# Patient Record
Sex: Female | Born: 1975 | Race: White | Hispanic: No | Marital: Married | Smoking: Former smoker
Health system: Southern US, Community
[De-identification: ages and names within clinical notes are randomized; demographics above are authoritative.]

## PROBLEM LIST (undated history)

## (undated) DIAGNOSIS — M797 Fibromyalgia: Secondary | ICD-10-CM

## (undated) DIAGNOSIS — D649 Anemia, unspecified: Secondary | ICD-10-CM

## (undated) DIAGNOSIS — N3010 Interstitial cystitis (chronic) without hematuria: Secondary | ICD-10-CM

## (undated) DIAGNOSIS — K5790 Diverticulosis of intestine, part unspecified, without perforation or abscess without bleeding: Secondary | ICD-10-CM

## (undated) DIAGNOSIS — K5792 Diverticulitis of intestine, part unspecified, without perforation or abscess without bleeding: Secondary | ICD-10-CM

## (undated) DIAGNOSIS — M719 Bursopathy, unspecified: Secondary | ICD-10-CM

## (undated) HISTORY — DX: Diverticulosis of intestine, part unspecified, without perforation or abscess without bleeding: K57.90

## (undated) HISTORY — DX: Interstitial cystitis (chronic) without hematuria: N30.10

## (undated) HISTORY — DX: Anemia, unspecified: D64.9

## (undated) HISTORY — PX: CERVICAL SPINE SURGERY: SHX589

## (undated) HISTORY — DX: Diverticulitis of intestine, part unspecified, without perforation or abscess without bleeding: K57.92

## (undated) HISTORY — DX: Bursopathy, unspecified: M71.9

## (undated) HISTORY — DX: Fibromyalgia: M79.7

---

## 2005-06-14 ENCOUNTER — Emergency Department: Payer: Self-pay | Admitting: Emergency Medicine

## 2007-09-20 ENCOUNTER — Emergency Department: Payer: Self-pay | Admitting: Internal Medicine

## 2009-09-02 HISTORY — PX: ABDOMINAL HYSTERECTOMY: SHX81

## 2010-01-10 ENCOUNTER — Ambulatory Visit: Payer: Self-pay | Admitting: Internal Medicine

## 2010-01-10 DIAGNOSIS — R21 Rash and other nonspecific skin eruption: Secondary | ICD-10-CM | POA: Insufficient documentation

## 2010-01-15 ENCOUNTER — Telehealth (INDEPENDENT_AMBULATORY_CARE_PROVIDER_SITE_OTHER): Payer: Self-pay | Admitting: *Deleted

## 2010-01-15 LAB — CONVERTED CEMR LAB
Basophils Absolute: 0 10*3/uL (ref 0.0–0.1)
Eosinophils Absolute: 0.1 10*3/uL (ref 0.0–0.7)
HCT: 41.3 % (ref 36.0–46.0)
Lymphs Abs: 2.3 10*3/uL (ref 0.7–4.0)
MCV: 90.2 fL (ref 78.0–100.0)
Monocytes Absolute: 0.3 10*3/uL (ref 0.1–1.0)
Platelets: 463 10*3/uL — ABNORMAL HIGH (ref 150.0–400.0)
RDW: 13.9 % (ref 11.5–14.6)
Sed Rate: 10 mm/hr (ref 0–22)

## 2010-02-22 ENCOUNTER — Ambulatory Visit: Payer: Self-pay | Admitting: Internal Medicine

## 2010-05-11 ENCOUNTER — Emergency Department: Payer: Self-pay | Admitting: Emergency Medicine

## 2010-05-11 IMAGING — CR DG CHEST 2V
1 series · 3 of 3 positions shown · non-contrast
Comparison: none

REASON FOR EXAM: FB
COMMENTS:

[Series 1: view not recorded · 0.17mm/px · 3 of 3 slices shown]
[im 1/3]
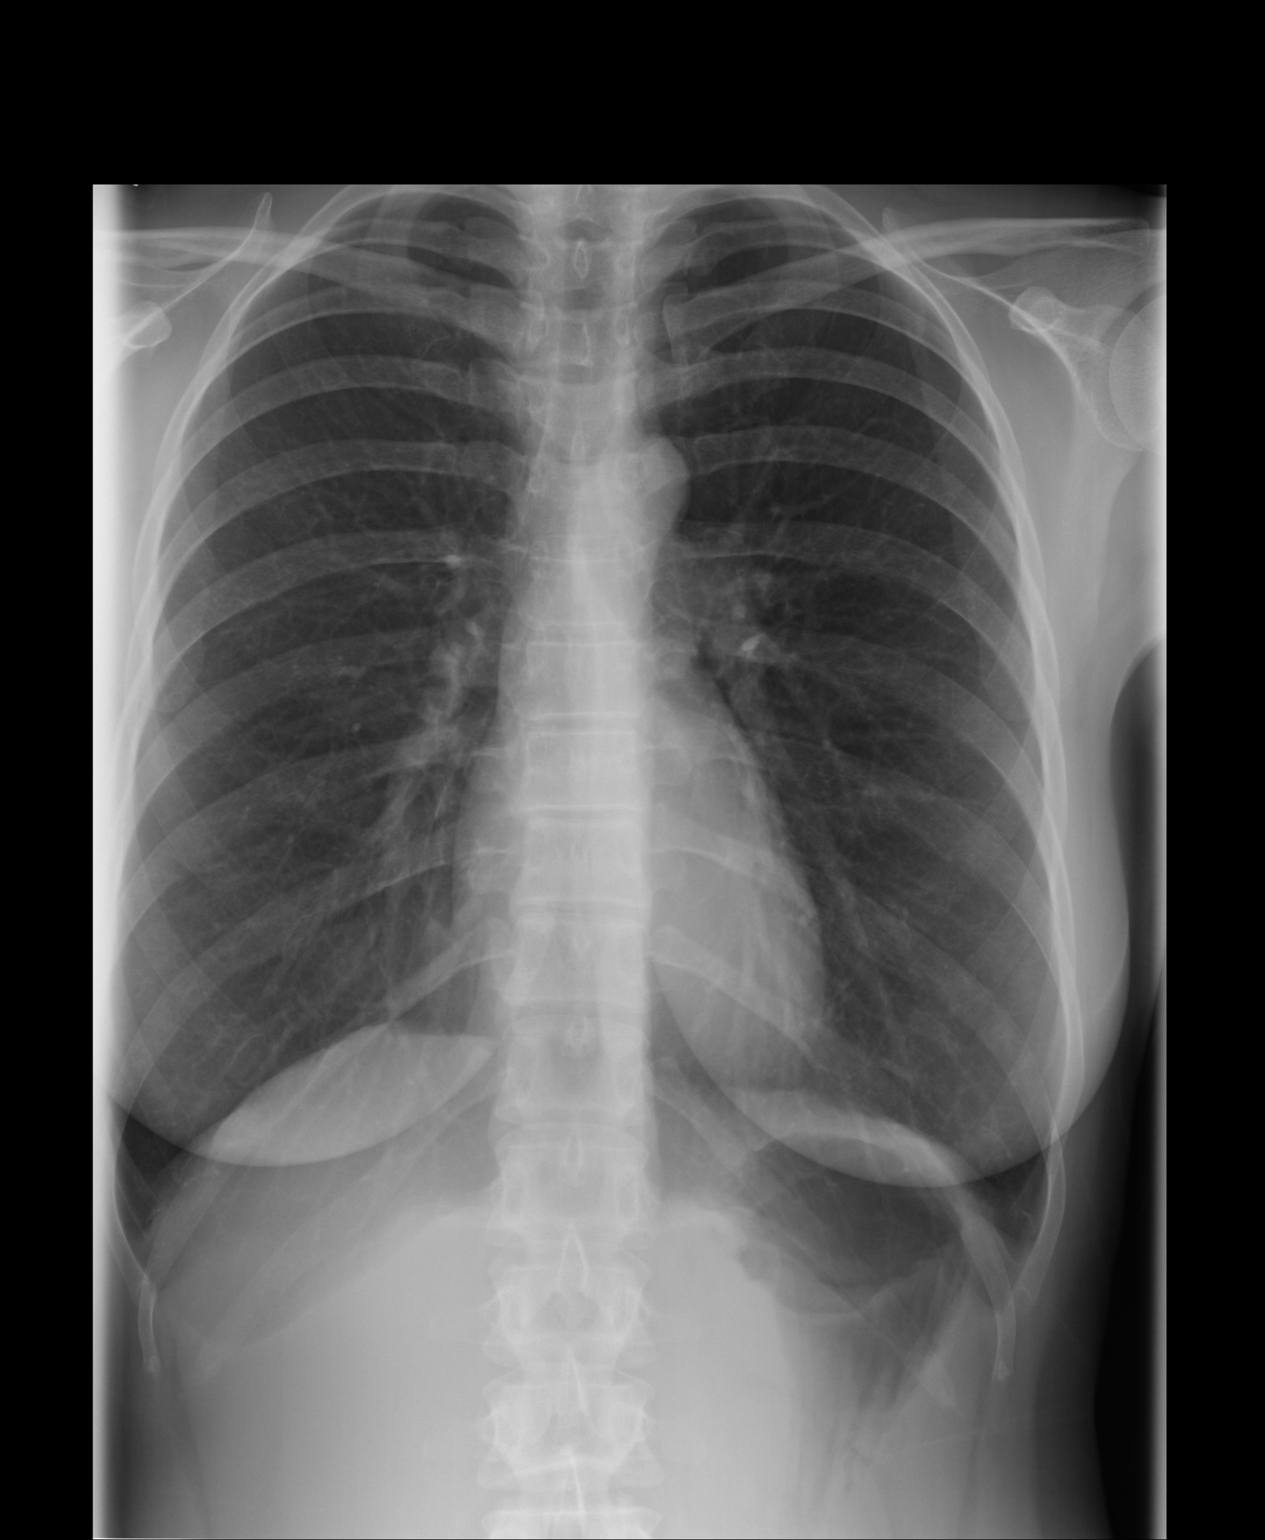
[im 2/3]
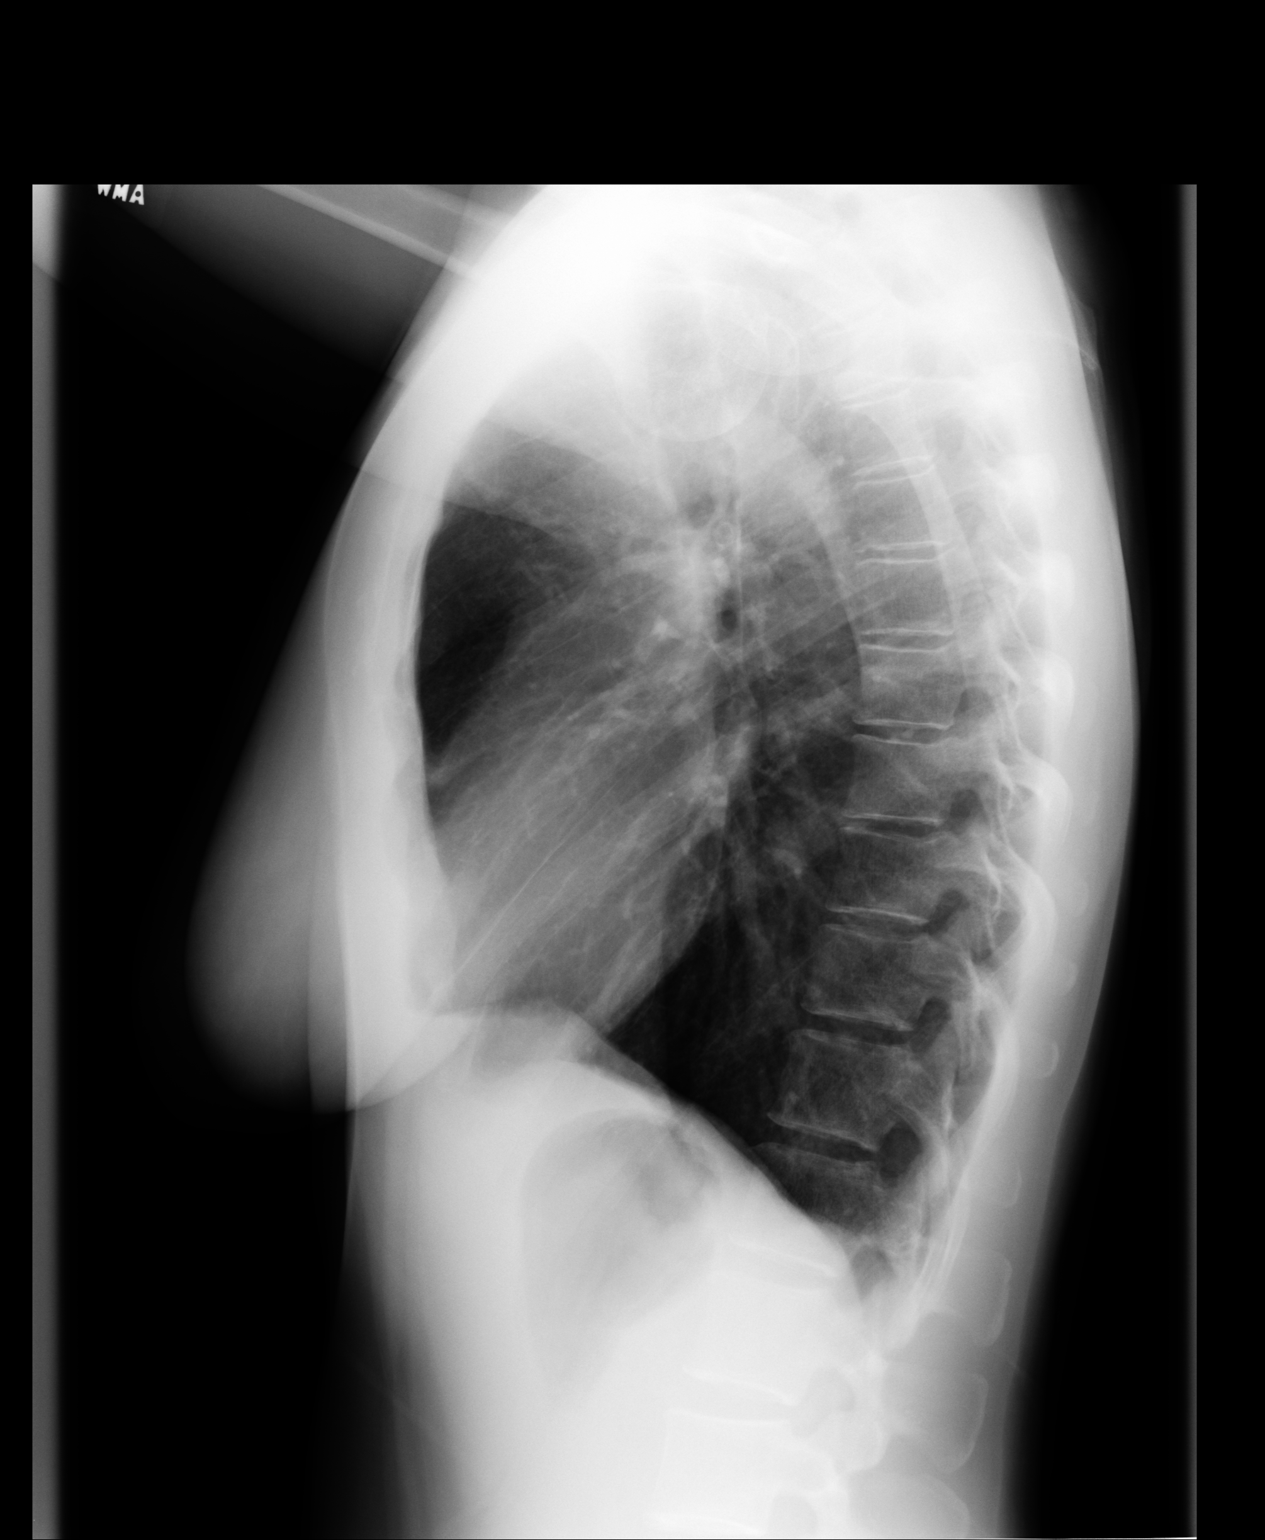
[im 3/3]
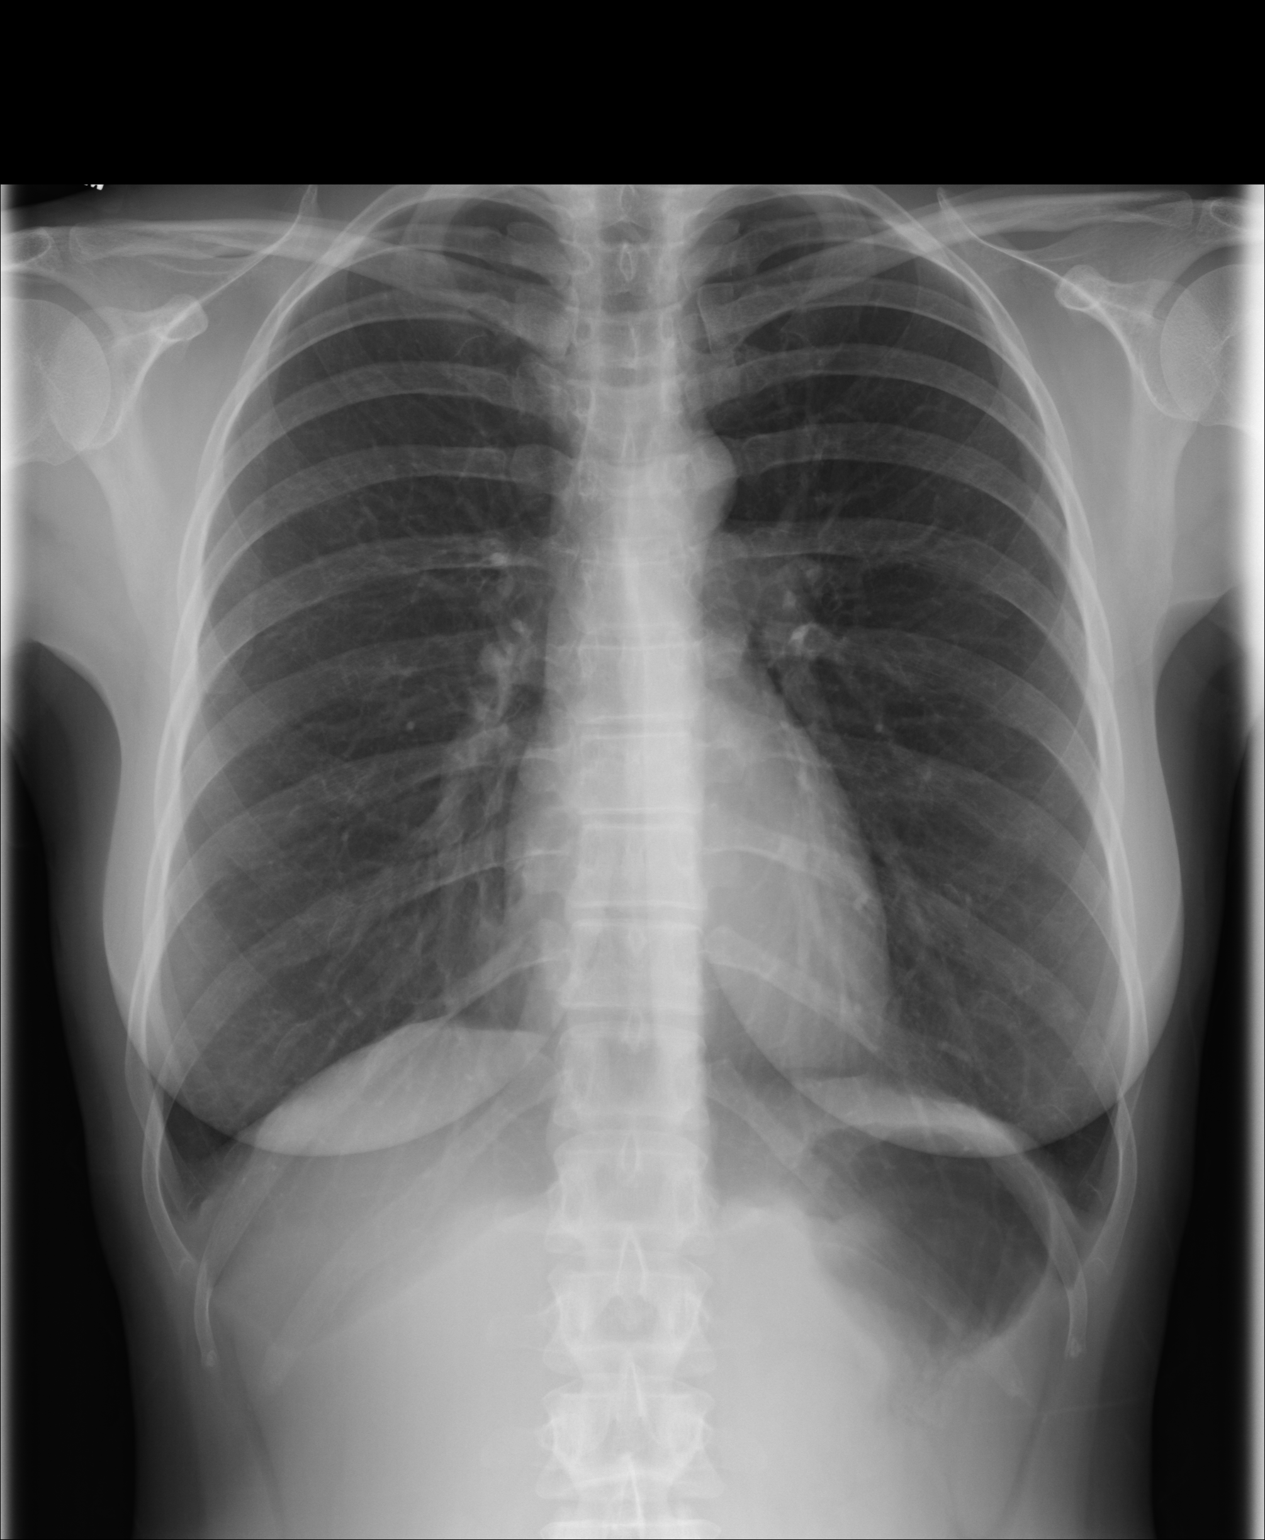

[3 of 3 positions shown; findings below may reference images not displayed]

PROCEDURE:     DXR - DXR CHEST PA (OR AP) AND LATERAL  - [DATE] [DATE]

RESULT:     The lung fields are clear. No pneumonia, pneumothorax or pleural
effusion is seen. The chest appears hyperexpanded bilaterally. The finding
could be due to body habitus but a history of reactive airway disease cannot
be excluded.
IMPRESSION: 1. The lung fields are clear.
2. No foreign body is seen in the visualized portion of the lower neck or
chest.
3. The chest appears bilaterally hyperexpanded suspicious for a history of
reactive airway disease.

## 2010-05-11 IMAGING — CR NECK SOFT TISSUES - 1+ VIEW
1 series · 2 of 2 positions shown · non-contrast
Comparison: none

REASON FOR EXAM: FB stuck in throat
COMMENTS:

[Series 1: view not recorded · 0.17mm/px · 2 of 2 slices shown]
[im 1/2]
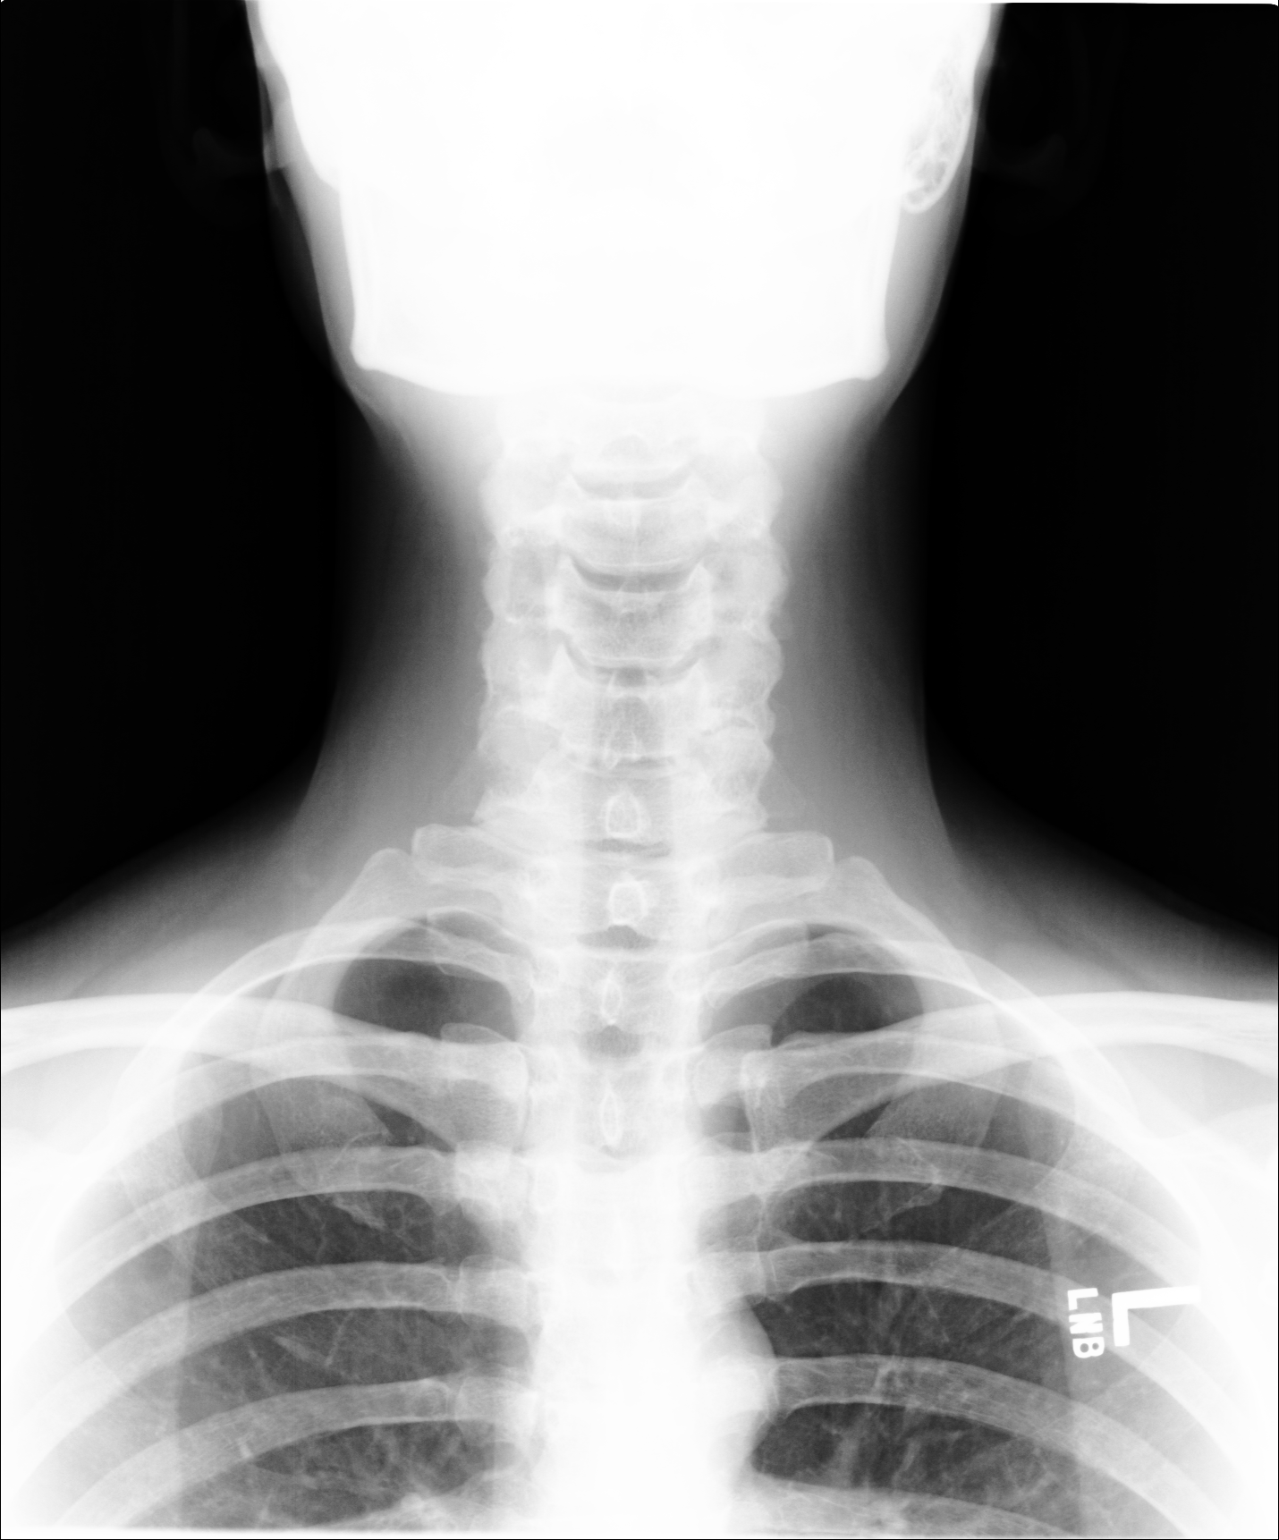
[im 2/2]
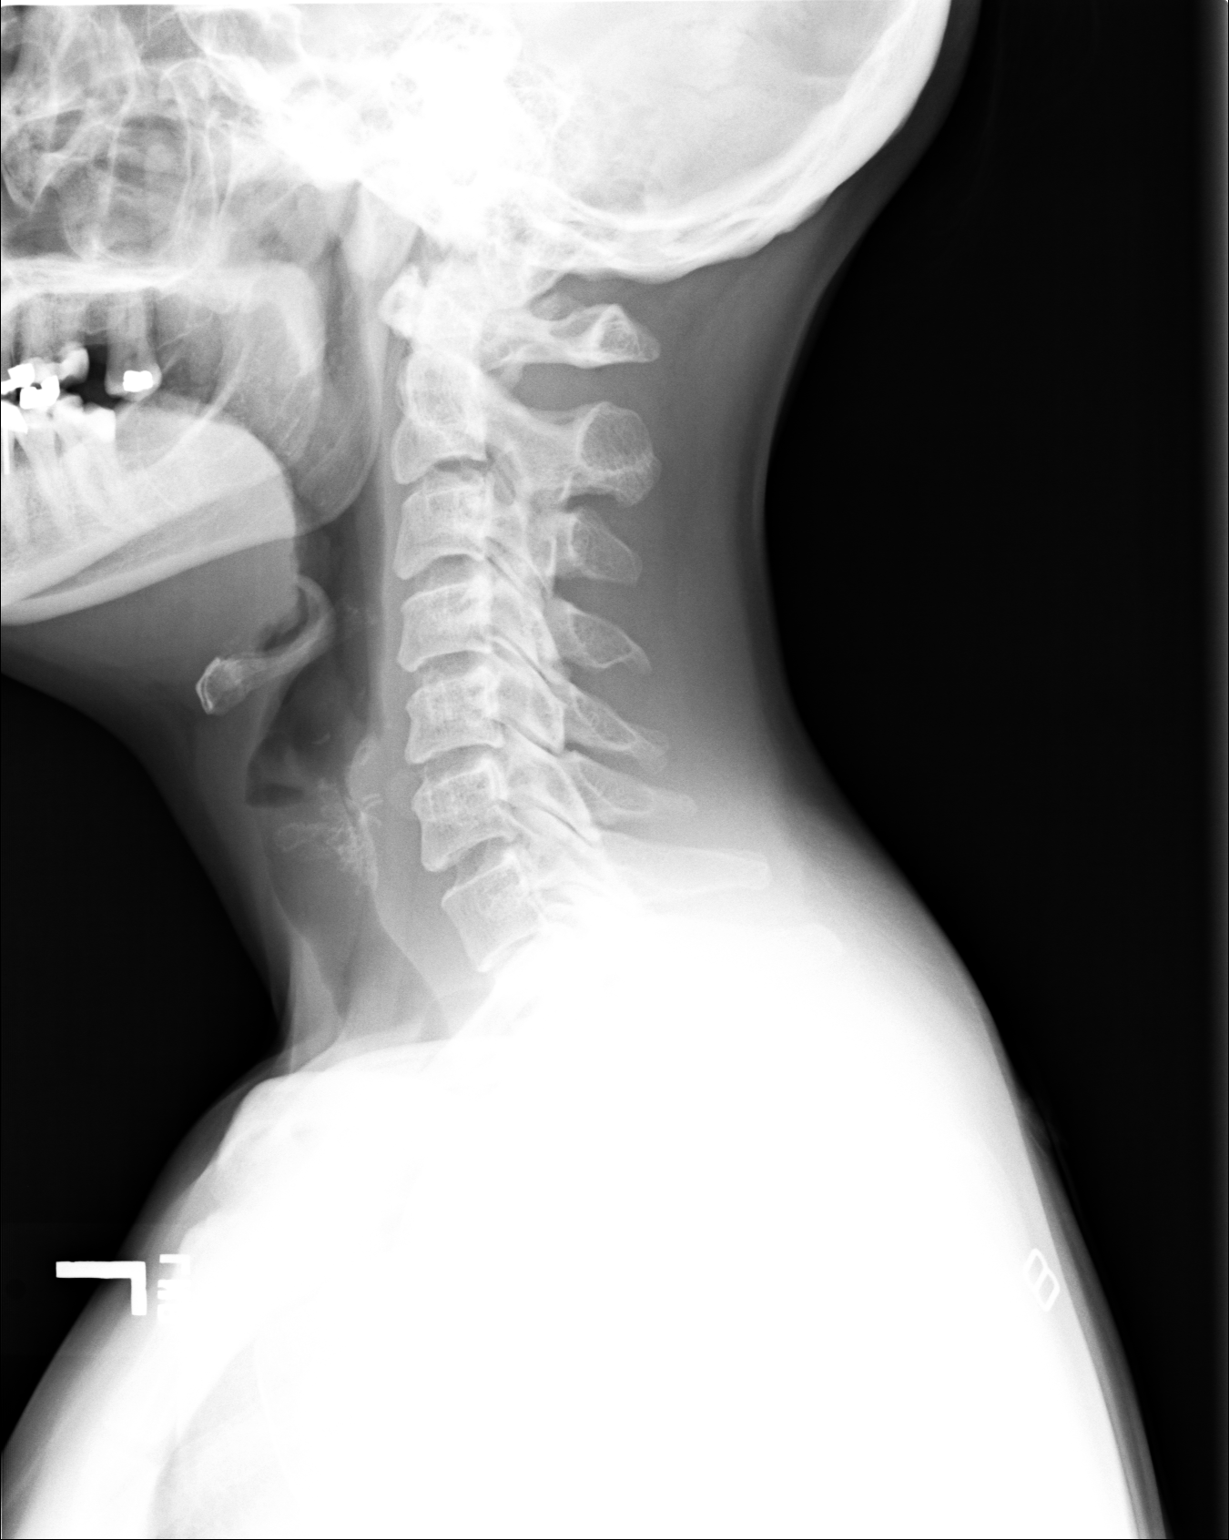

[2 of 2 positions shown; findings below may reference images not displayed]

PROCEDURE:     DXR - DXR SOFT TISSUE NECK  - [DATE] [DATE]

RESULT:     Views of the neck obtained with soft tissue technique show no
definite radiopaque foreign body. Note is made that some fish bones are
cartilaginous and are not visualized on routine radiography. No soft tissue
gas is identified. No retropharyngeal soft tissue swelling is seen. The
epiglottis is normal in size.
IMPRESSION: 1.     No significant abnormalities are noted.

## 2010-08-16 ENCOUNTER — Encounter
Admission: RE | Admit: 2010-08-16 | Discharge: 2010-08-16 | Payer: Self-pay | Source: Home / Self Care | Attending: Geriatric Medicine | Admitting: Geriatric Medicine

## 2010-08-16 IMAGING — CR DG FACIAL BONES COMPLETE 3+V
4 series · 4 of 4 positions shown · non-contrast
Comparison: None.

CLINICAL DATA: Left-sided facial pain, injured 2 days ago

FACIAL BONES COMPLETE 3+V

[view not recorded (1 of 4)]
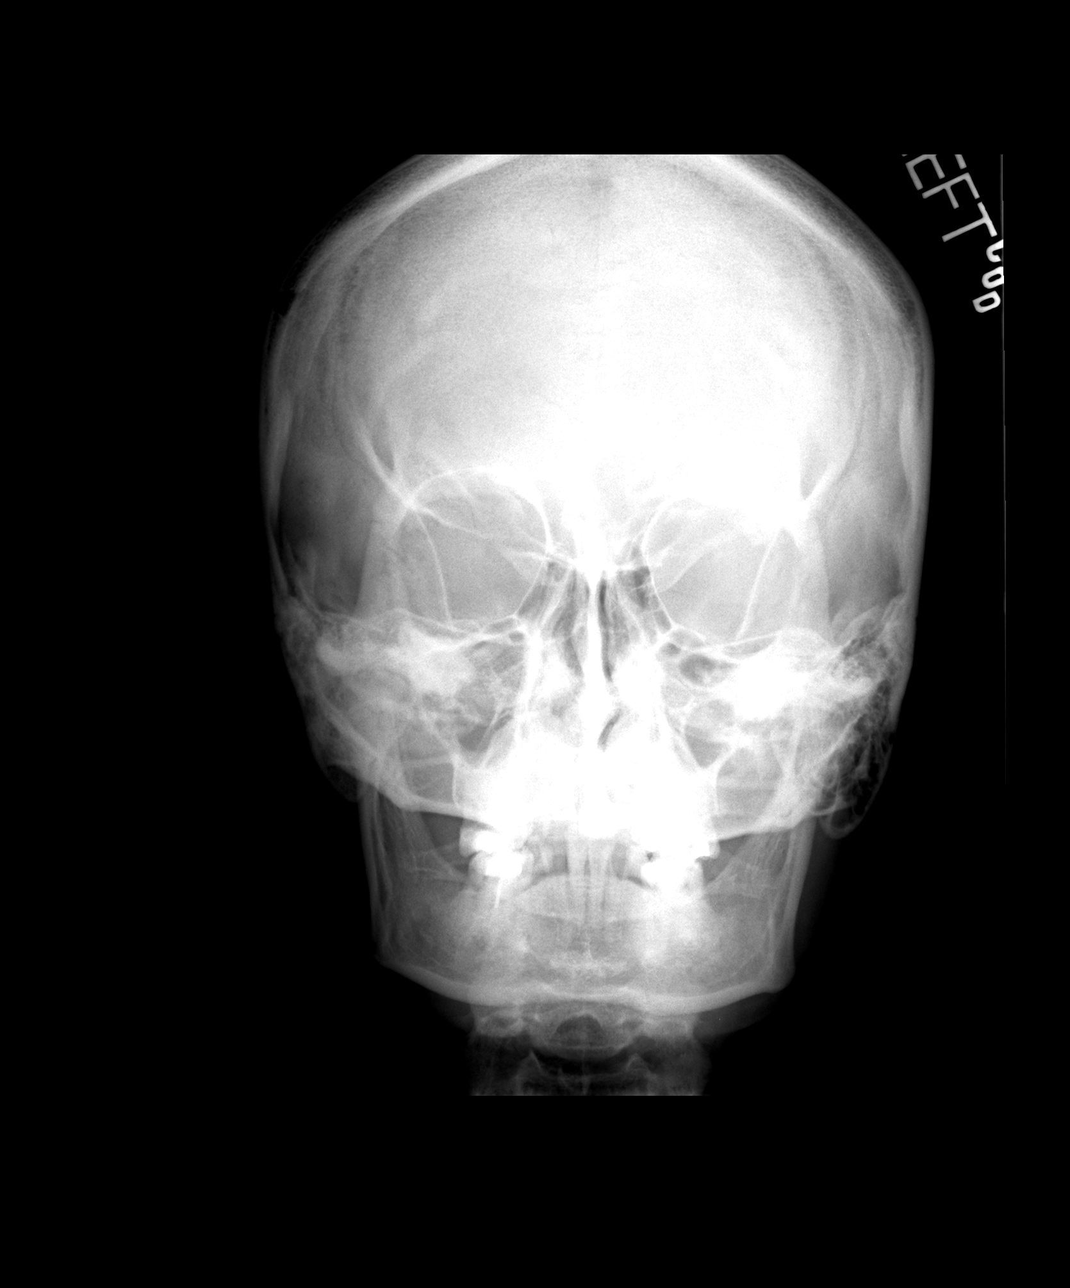

[view not recorded (2 of 4)]
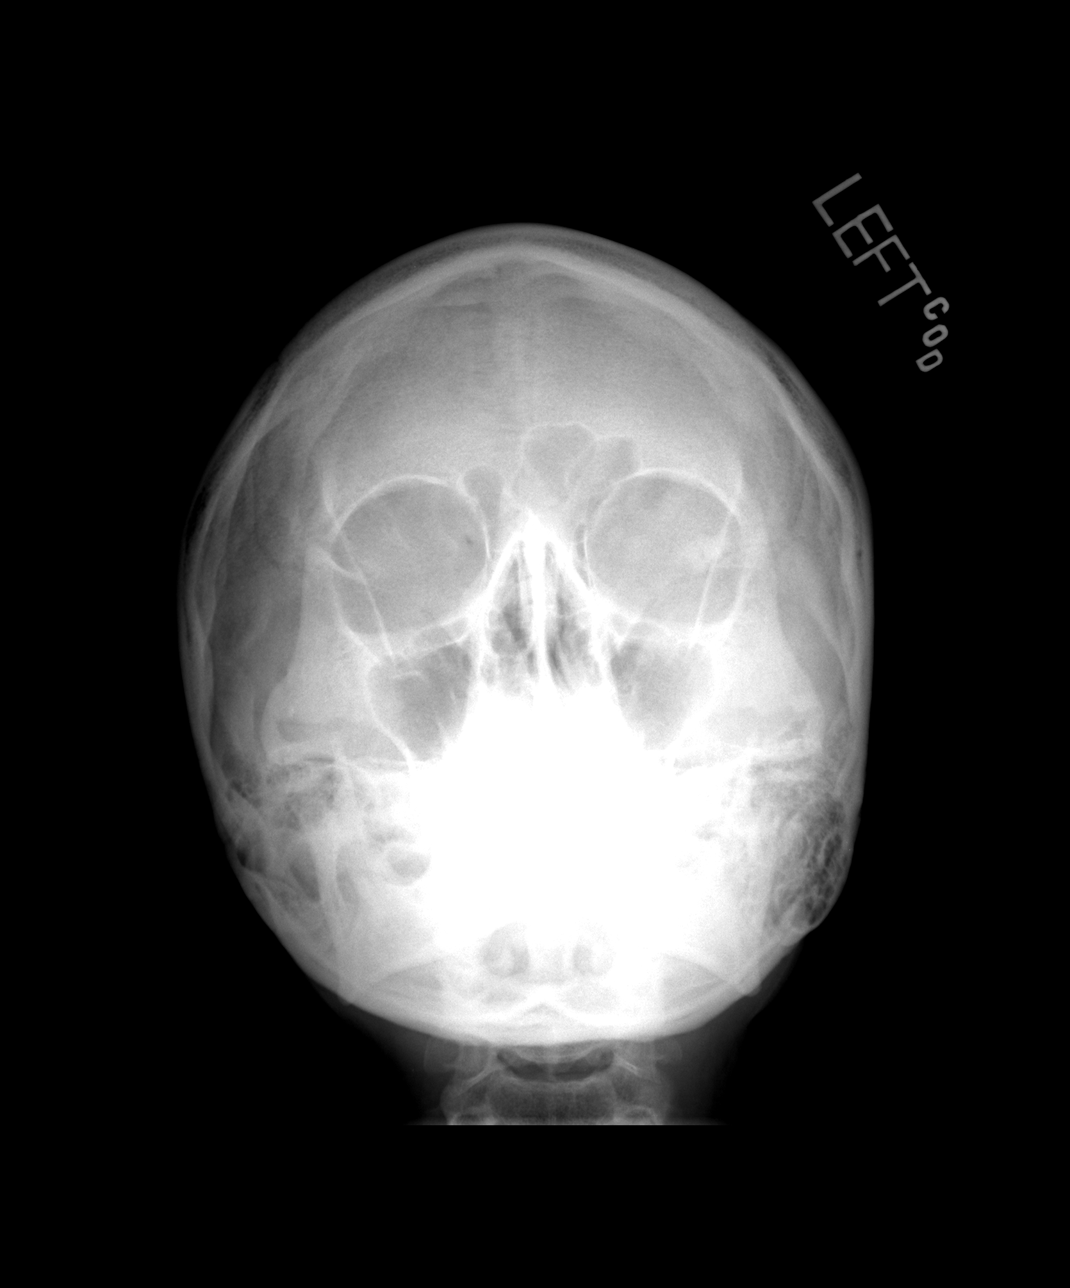

[view not recorded (3 of 4)]
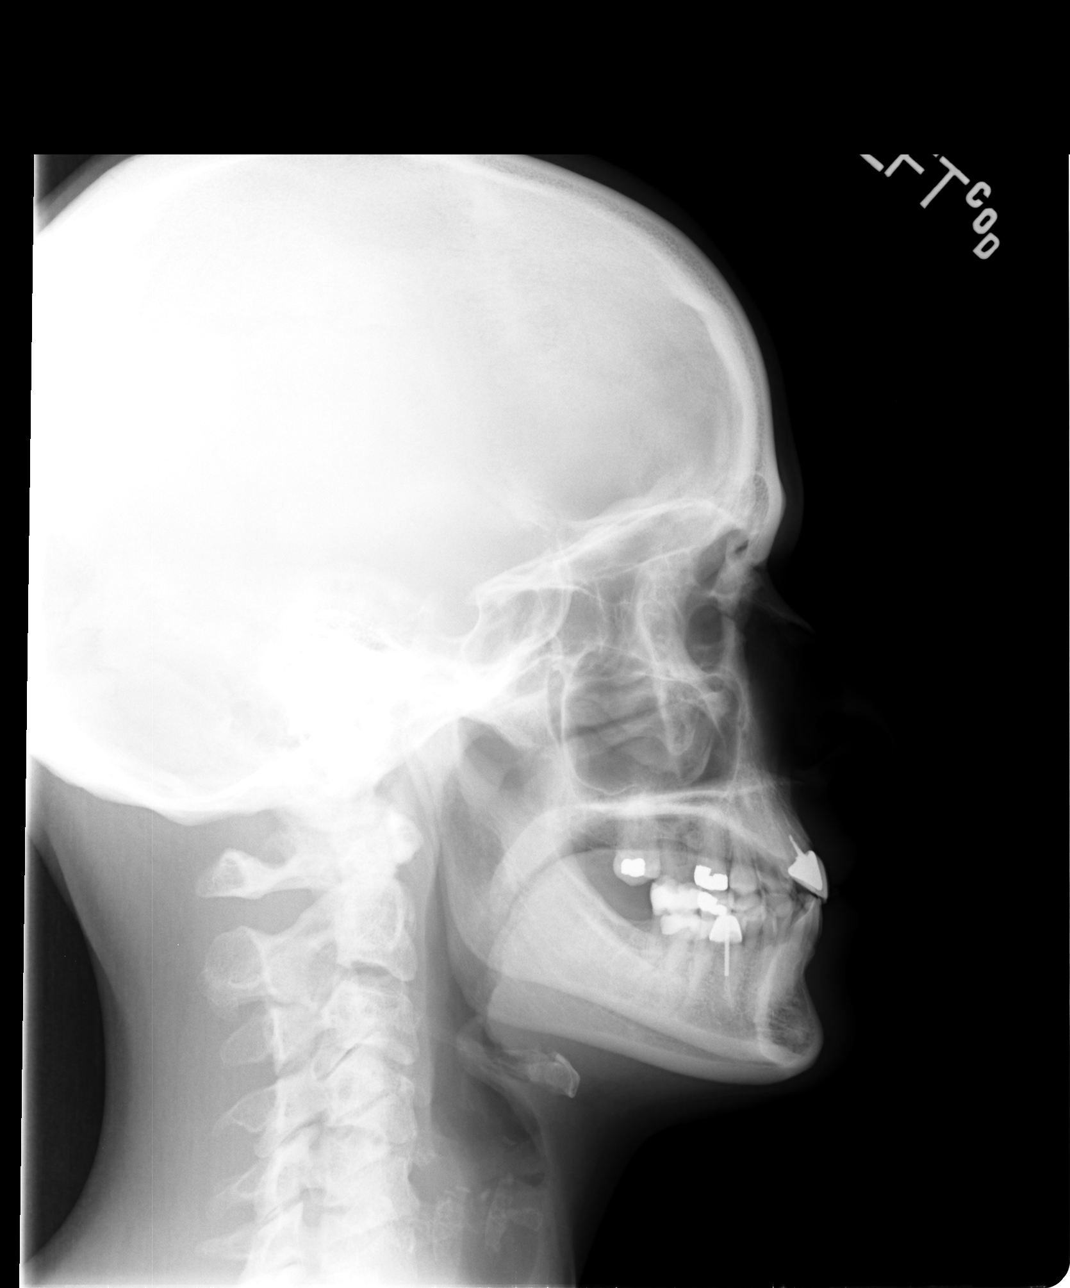

[view not recorded (4 of 4)]
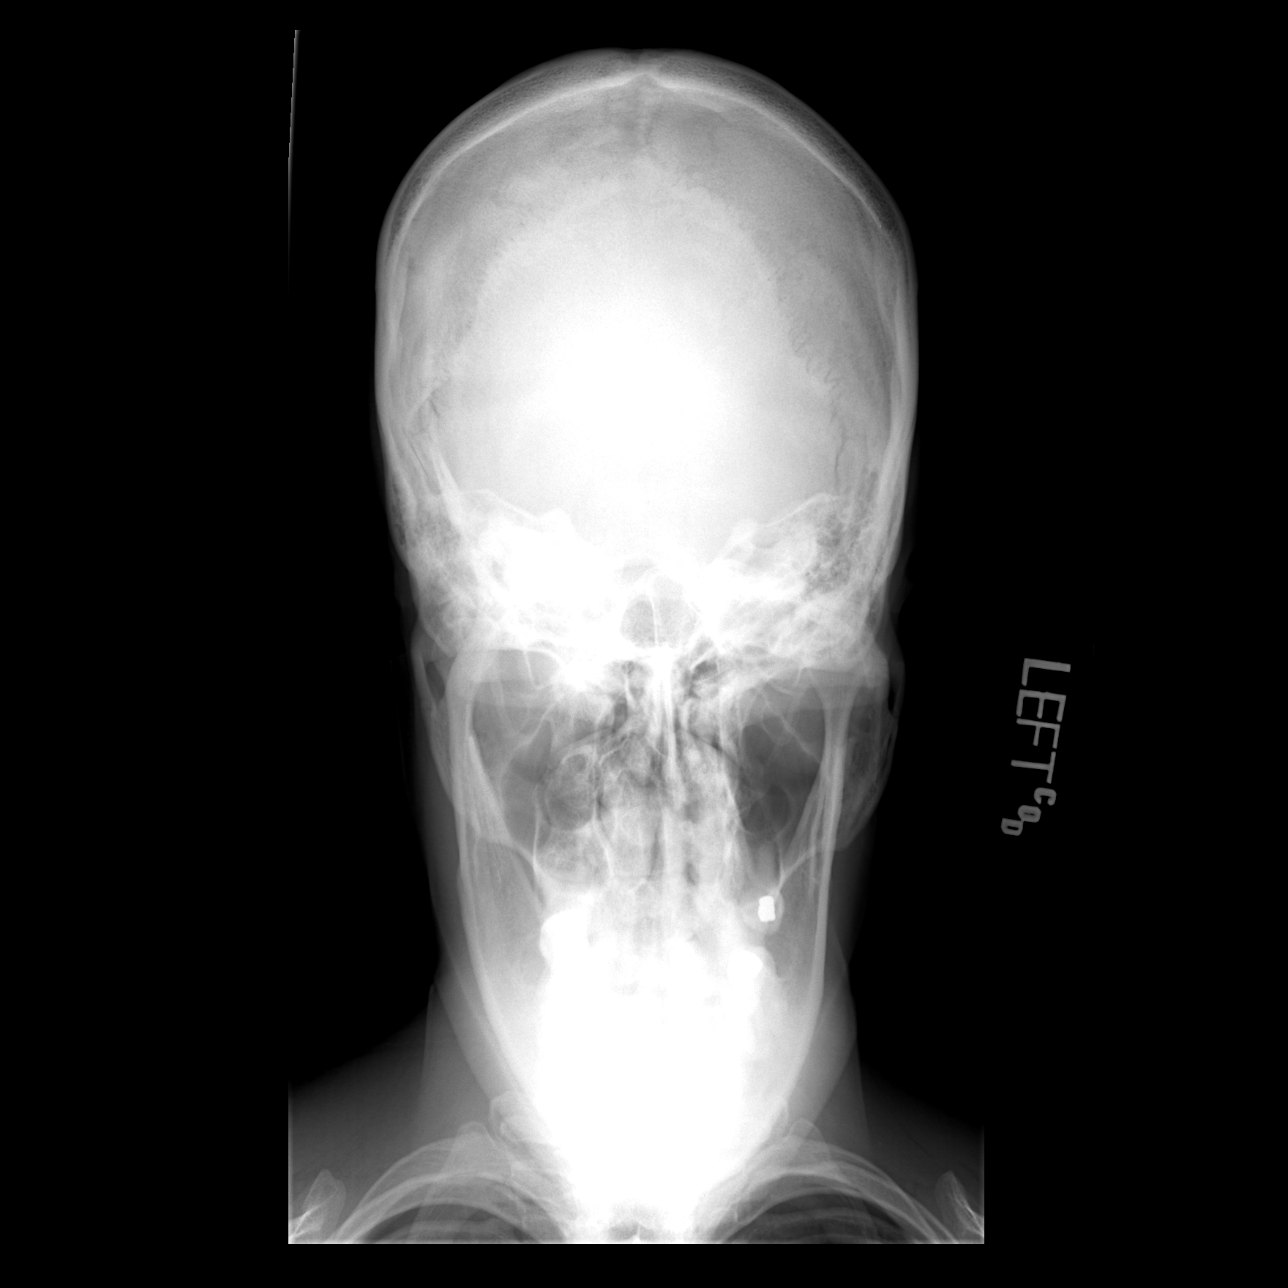

[4 of 4 positions shown; findings below may reference images not displayed]

FINDINGS: No facial bone fracture is seen.  The paranasal sinuses
are clear.  The zygomatic arches appear intact.
IMPRESSION: No evidence of facial bone fracture.

## 2010-08-20 ENCOUNTER — Encounter
Admission: RE | Admit: 2010-08-20 | Discharge: 2010-08-20 | Payer: Self-pay | Source: Home / Self Care | Attending: Geriatric Medicine | Admitting: Geriatric Medicine

## 2010-08-20 IMAGING — CT CT ABD-PELV W/ CM
2 of 4 series · 17 of 46 positions shown, 19 images · IV contrast (READICAT/WATER & [ID] OMNI 300)
Comparison: None.

CLINICAL DATA: Diffuse abdominal pain.  Back pain.

CT ABDOMEN AND PELVIS WITH CONTRAST
TECHNIQUE: Multidetector CT imaging of the abdomen and pelvis was
performed following the standard protocol during bolus
administration of intravenous contrast.
Contrast: 100 ml [3A].

[Series 2: abdomen w/ · axial · 0.66mm/px · z∈[-371,-21]mm · 14 of 78 slices shown, 16 images]
[im 4/78  soft-tissue]
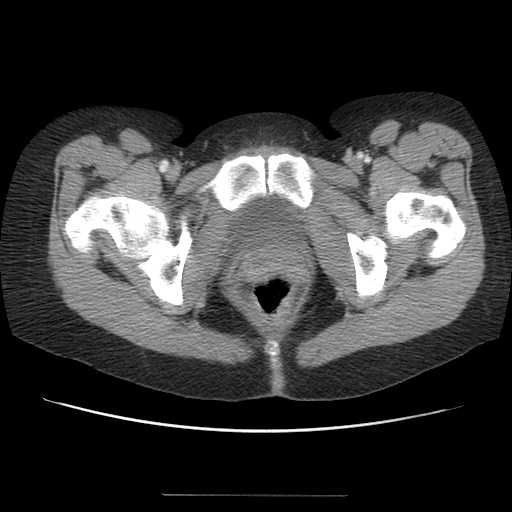
[im 4/78  bone]
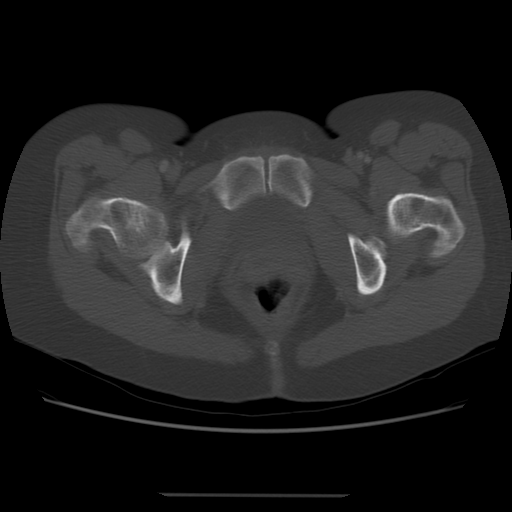
[im 10/78  soft-tissue]
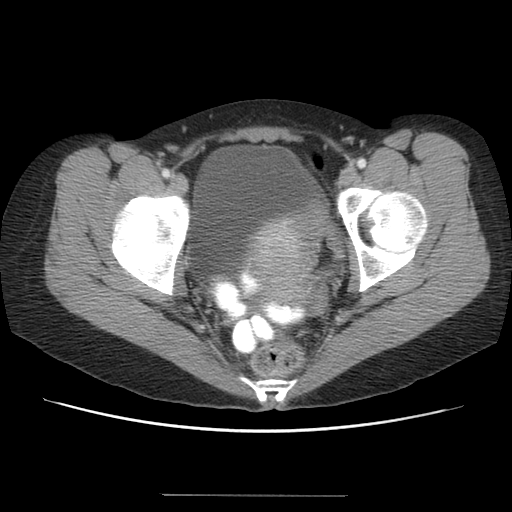
[im 17/78  soft-tissue]
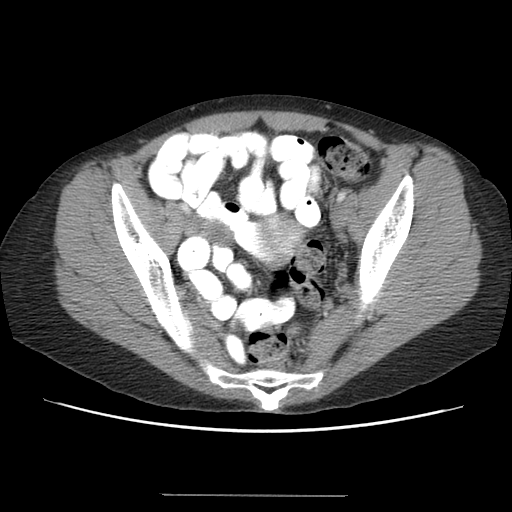
[im 20/78  soft-tissue]
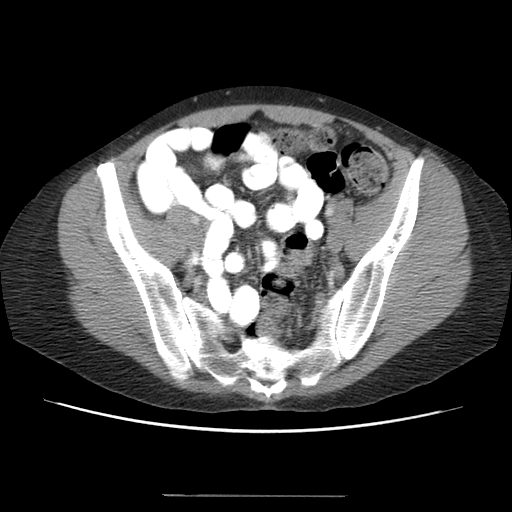
[im 26/78  soft-tissue]
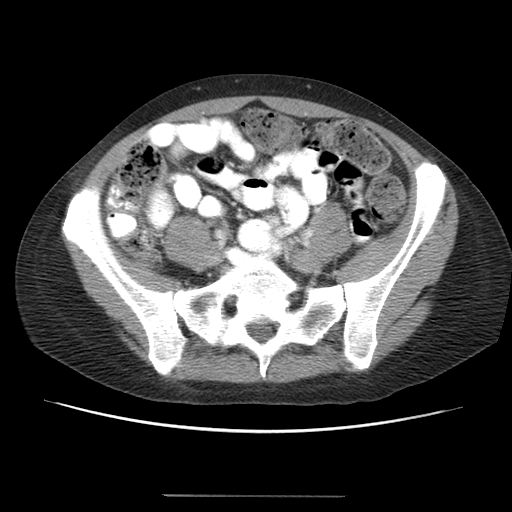
[im 33/78  soft-tissue]
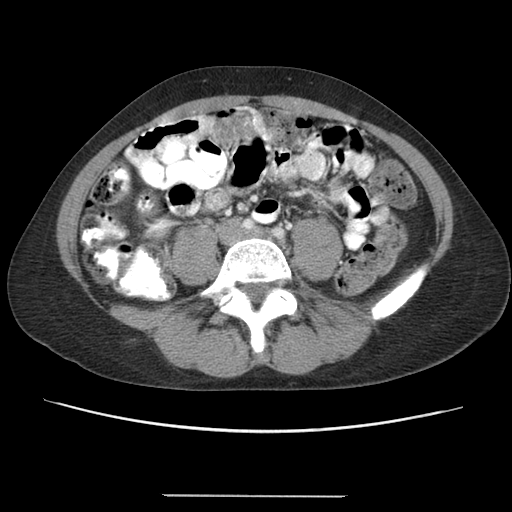
[im 36/78  soft-tissue]
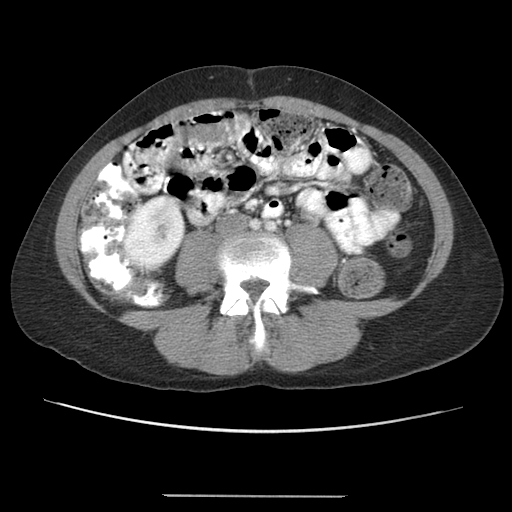
[im 42/78  soft-tissue]
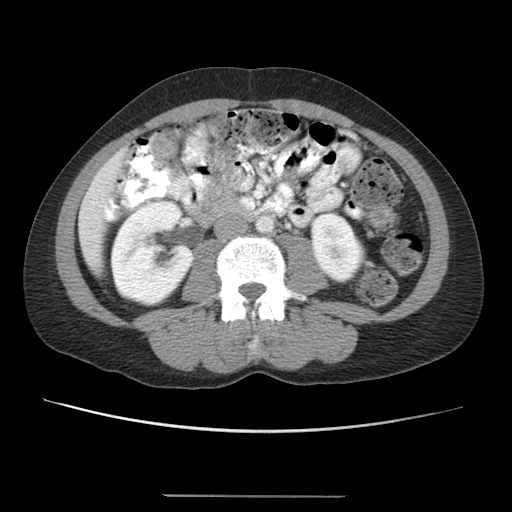
[im 45/78  soft-tissue]
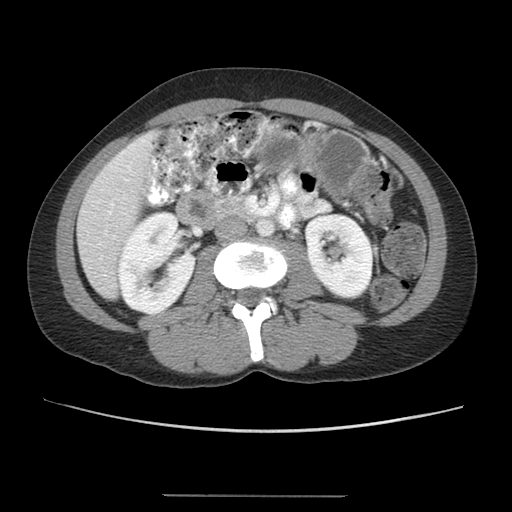
[im 45/78  bone]
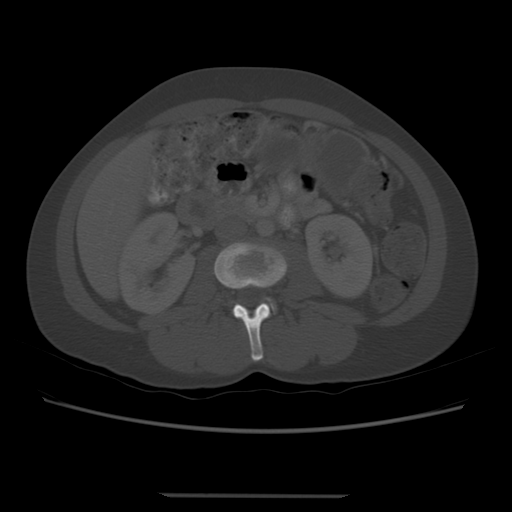
[im 52/78  soft-tissue]
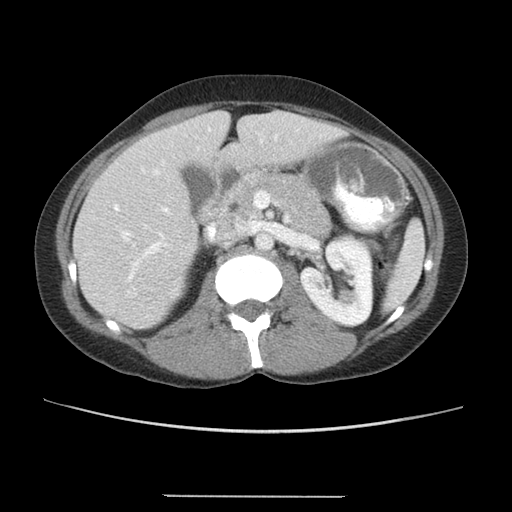
[im 58/78  soft-tissue]
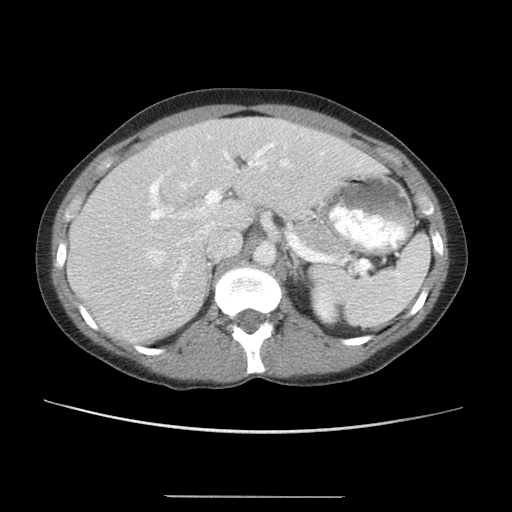
[im 61/78  soft-tissue]
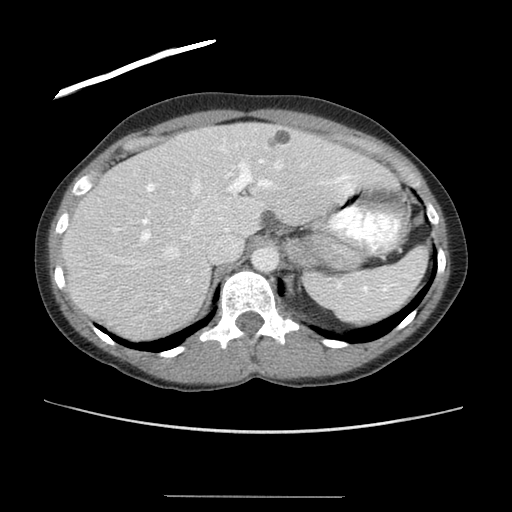
[im 68/78  soft-tissue]
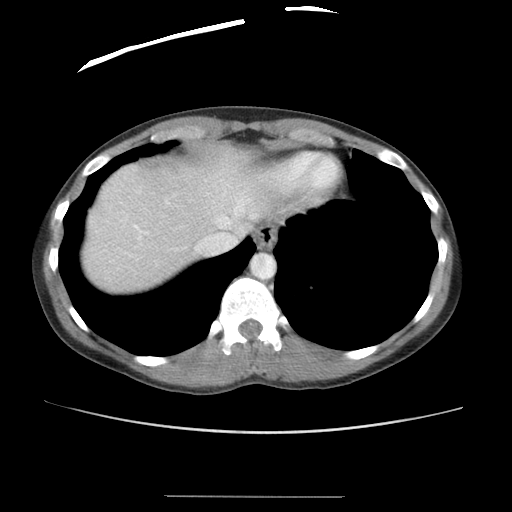
[im 74/78  soft-tissue]
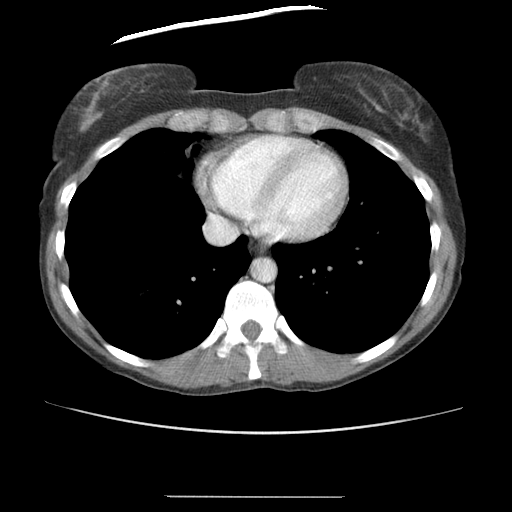

[Series 400: cor · coronal · 0.87mm/px · 3 of 96 slices shown]
[im 32/96  soft-tissue]
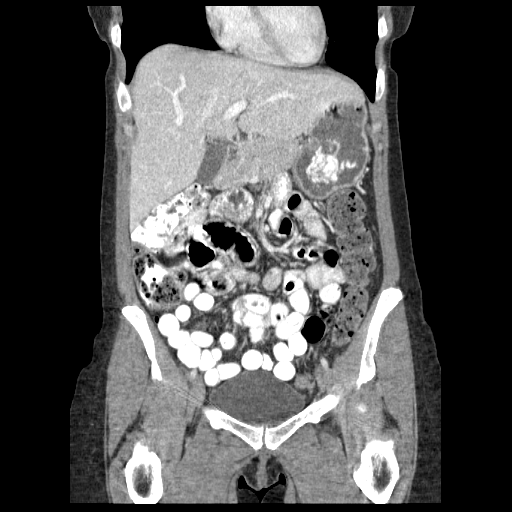
[im 43/96  soft-tissue]
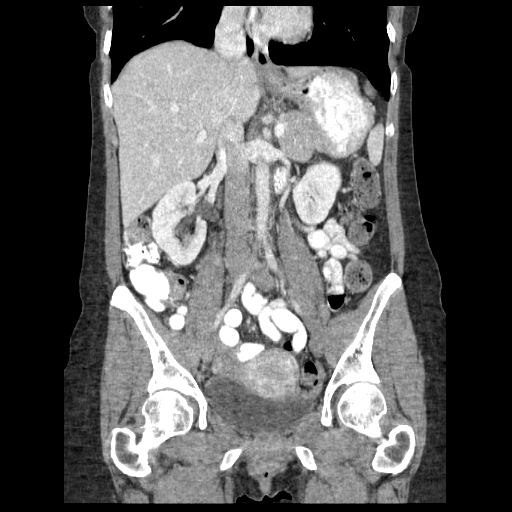
[im 53/96  soft-tissue]
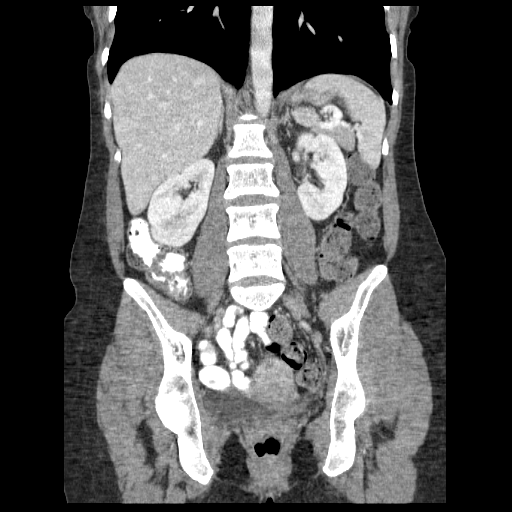

[17 of 46 positions shown; findings below may reference images not displayed]

FINDINGS: Lung bases show minimal dependent subpleural atelectasis.
Scarring in the right middle lobe and lingula.  Heart size normal.
No pericardial or pleural effusion.

Low attenuation lesions in the liver measure up to 1.1 cm, and are
likely cysts.  Gallbladder, adrenal glands, kidneys, spleen,
pancreas, small bowel are unremarkable.  Stool is seen in the
majority of the colon.  No pathologically enlarged lymph nodes.  No
free fluid.  No worrisome lytic or sclerotic lesions.
IMPRESSION: 1.  No evidence of diverticulitis.
2.  Constipation.

## 2010-10-04 NOTE — Assessment & Plan Note (Signed)
Summary: allergies   Vital Signs:  Patient profile:   35 year old female Weight:      120 pounds O2 Sat:      100 % on Room air Pulse rate:   106 / minute BP sitting:   122 / 76  (left arm) Cuff size:   regular  Vitals Entered By: Reynaldo Minium CMA (Jan 10, 2010 9:51 AM)  O2 Flow:  Room air  Primary Provider/Referring Provider:  Feliciana Rossetti, MD  CC:  allergy consult-dr hooper.  History of Present Illness: Jan 10, 2010- 34 yoF seen at kind request of Dr Quintella Reichert concerned about allergies. She complains of perennial rash recurrent over 5 years and worse in the past 2 years, She thinks she has been worse since pregnant with  daughter,. Hands and face are most involved, worse with sun exposure and heat. She itches after taking a shower. Raised welts sound like urticaria. Antinhistamines didn't help much and tended tooverdry. Steroid trials helped in past but more recently didn't seem to- last was 2 months ago. Mometasone ointment did help. Now she uses Epicream. Skin testing said negative 2 years ago. "Pollen" blamed for nasal itching and pressure in her ears. She also suspects food allergy- none specific- but suspects this is basis for complaints of tooth decay, hair loss, variable weight, heart burn, diarrhea, constipation and bloating. No pattern associated with wheat/ gluten is recognized.  Contact dermatitis from latex. No problem from contrast dye or aspirin.  Current Medications (verified): 1)  Omeprazole 40 Mg Cpdr (Omeprazole) .... Take 1 By Mouth Once Daily 2)  Loestrin 24 Fe 1-20 Mg-Mcg Tabs (Norethin Ace-Eth Estrad-Fe) .... Take 1 By Mouth Once Daily 3)  Adderall 20 Mg Tabs (Amphetamine-Dextroamphetamine) .... Take 1/2 By Mouth Two Times A Day 4)  Citalopram Hydrobromide 20 Mg Tabs (Citalopram Hydrobromide) .... Take 1 By Mouth At Bedtime 5)  Allegra 180 Mg Tabs (Fexofenadine Hcl) .... Take 1 By Mouth Once Daily  Allergies (verified): 1)  ! Clindamycin 2)  ! Metals 3)  !  Flagyl 4)  ! 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  The patient denies shortness of breath with activity, productive cough, non-productive cough, coughing up blood, chest pain, irregular heartbeats, loss of appetite, sore throat, depression, hand/feet swelling, joint stiffness or pain, change in color of mucus, and fever.    Physical Exam  Additional Exam:  General: A/Ox3; pleasant and cooperative, NAD, talkative, wdwn SKIN: no rash, lesions NODES: no lymphadenopathy HEENT: [place]/AT, EOM- WNL, Conjuctivae- clear, PERRLA, TM-WNL, Nose- clear, Throat- clear and wnl, Mallampati  II, tongue coated NECK: Supple w/ fair ROM, JVD- none, normal carotid impulses w/o bruits Thyroid- normal to palpation CHEST: Clear to P&A HEART: RRR, no m/g/r heard ABDOMEN: Soft and nl; nml bowel sounds; no organomegaly or masses noted XBJ:YNWG, nl pulses, no edema  NEURO: Grossly intact to observation      Impression & Recommendations:  Problem # 1:  ? of  ALLERGY, FOOD (ICD-693.1)  Broad ranging symptoms, many probably unrelated. Question if rashes and GI upset may have any atopic basis, including possibility of celiac disease. We will send appropriate labs. Counseling. Food diary.  Medications Added to Medication List This Visit: 1)  Omeprazole 40 Mg Cpdr  (Omeprazole) .... Take 1 by mouth once daily 2)  Loestrin 24 Fe 1-20 Mg-mcg Tabs (Norethin ace-eth estrad-fe) .... Take 1 by mouth once daily 3)  Adderall 20 Mg Tabs (Amphetamine-dextroamphetamine) .... Take 1/2 by mouth two times a day 4)  Citalopram Hydrobromide 20 Mg Tabs (Citalopram hydrobromide) .... Take 1 by mouth at bedtime 5)  Allegra 180 Mg Tabs (Fexofenadine hcl) .... Take 1 by mouth once daily  Other Orders: Consultation Level IV (03474) Tobacco use cessation intermediate 3-10 minutes (99406) TLB-CBC Platelet - w/Differential (85025-CBCD) TLB-Sedimentation Rate (ESR) (85652-ESR) T-Food Allergy Profile Specific IgE ([id number]) T-Allergy Profile [place], [place], [place], [place], [place] [phone]) T- * Misc. Laboratory test [phone])  Patient Instructions: 1)  Please schedule a follow-up appointment in 1 month. 2)  Try keeping a diary of symptoms, especially break-outs of rash and stomach upset, along with a diary of what you eat. 3)  Lab 4)  Consider Neutrogena soap. Bathe with alpha Lorina Rabon

## 2010-10-04 NOTE — Assessment & Plan Note (Signed)
Summary: 1 month/apc   Primary Provider/Referring Provider:  Feliciana Rossetti, MD  CC:  1 month follow up visit-"better since stopping BC pills"..  History of Present Illness: Jan 10, 2010- 35 yoF seen at kind request of Dr Quintella Reichert concerned about allergies. She complains of perennial rash recurrent over 5 years and worse in the past 2 years, She thinks she has been worse since pregnant with  daughter,. Hands and face are most involved, worse with sun exposure and heat. She itches after taking a shower. Raised welts sound like urticaria. Antinhistamines didn't help much and tended tooverdry. Steroid trials helped in past but more recently didn't seem to- last was 2 months ago. Mometasone ointment did help. Now she uses Epicream. Skin testing said negative 2 years ago. "Pollen" blamed for nasal itching and pressure in her ears. She also suspects food allergy- none specific- but suspects this is basis for complaints of tooth decay, hair loss, variable weight, heart burn, diarrhea, constipation and bloating. No pattern associated with wheat/ gluten is recognized.  Contact dermatitis from latex. No problem from contrast dye or aspirin.  February 22, 2010- ? Food allergy, Rash/ itch She tried keeping a food diary: milk, fried, ice cream, chinese food, coffee/ caffeine. Usual complaints are GI. Chocolate seems hard to swallow- irritating throat and sometimes itching. She likes cereal with milk or some ice cream but understands that she must use a lactase replacement or stick to very small amounts. She stopped BCPs  after 6 years. So far, after less than a month, she is having less itching. We discussed photosensitization from meds.   Preventive Screening-Counseling & Management  Alcohol-Tobacco     Smoking Status: current-6 years     Smoking Cessation Counseling: yes     Smoke Cessation Stage: contemplative     Packs/Day: 0.5     Tobacco Counseling: to quit use of tobacco products  Current Medications  (verified): 1)  Omeprazole 40 Mg Cpdr (Omeprazole) .... Take 1 By Mouth Once Daily 2)  Adderall 20 Mg Tabs (Amphetamine-Dextroamphetamine) .... Take 1/2 By Mouth Two Times A Day 3)  Claritin 10 Mg Tabs (Loratadine) .... Take 1 By Mouth Once Daily  Allergies (verified): 1)  ! Clindamycin 2)  ! Metals 3)  ! Flagyl 4)  ! 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  The patient denies shortness of breath with activity, shortness of breath at rest, productive cough, non-productive cough, coughing up blood, chest pain, irregular heartbeats, loss of appetite, weight change, abdominal pain, tooth/dental problems, headaches, nasal congestion/difficulty breathing through nose, and sneezing.    Vital Signs:  Patient profile:   35 year old female Height:      63.5 inches Weight:      117 pounds BMI:     20.47 O2 Sat:      100 % on Room air Pulse rate:  93 / minute BP sitting:   100 / 68  (left arm) Cuff size:   regular  Vitals Entered By: Reynaldo Minium CMA (February 22, 2010 10:14 AM)  O2 Flow:  Room air CC: 1 month follow up visit-"better since stopping BC pills".   Physical Exam  Additional Exam:  General: A/Ox3; pleasant and cooperative, NAD, talkative, wdwn SKIN: no rash,  lesions NODES: no lymphadenopathy HEENT: [place]/AT, EOM- WNL, Conjuctivae- clear, PERRLA, TM-WNL, Nose- clear, Throat- clear and wnl, Mallampati  II, tongue coated NECK: Supple w/ fair ROM, JVD- none, normal carotid impulses w/o bruits Thyroid- normal to palpation CHEST: Clear to P&A HEART: RRR, no m/g/r heard ABDOMEN: Soft and nl;  NUU:VOZD, nl pulses, no edema  NEURO: Grossly intact to observation      Impression & Recommendations:  Problem # 1:  ? of ALLERGY, FOOD (ICD-693.1)  Unable to document specific IgE food allergy. Recommend she avoid problem foods.  Problem # 2:  TOBACCO USER (ICD-305.1)  Recommended smoking cessation and discussed available support. attempting to motivate.  Problem # 3:  SKIN RASH (ICD-782.1)  Sun exposure may include some photsensitization. [place] off BCPs. Advised PABA free sunblock.  Medications Added to Medication List This Visit: 1)  Claritin 10 Mg Tabs (Loratadine) .... Take 1 by mouth once daily  Other Orders: Est. Patient Level IV (66440)  Patient Instructions: 1)  Please schedule a follow-up appointment as needed. 2)  Copy sent to: Arrie Aran 3)  Avoid foods you find you don't digest well. Consider trying a lactase supplement- sold usually at the dairy case at your grocery store. Try lactose-free milk. 4)  Some people get stomach irritation even with decaf coffee.

## 2010-10-04 NOTE — Progress Notes (Signed)
Summary: results  Phone Note Call from Patient Call back at [phone]   Caller: Patient Call For: young Summary of Call: calling for lab results Initial call taken by: Rickard Patience,  Jan 15, 2010 2:25 PM  Follow-up for Phone Call        Spoke with pt and notified of results/recs per CDY. Vernie Murders  Jan 15, 2010 2:28 PM

## 2011-09-17 ENCOUNTER — Other Ambulatory Visit: Payer: Self-pay | Admitting: Geriatric Medicine

## 2011-09-17 DIAGNOSIS — R05 Cough: Secondary | ICD-10-CM

## 2011-09-17 DIAGNOSIS — H9209 Otalgia, unspecified ear: Secondary | ICD-10-CM

## 2011-09-19 ENCOUNTER — Other Ambulatory Visit: Payer: Self-pay

## 2011-09-20 ENCOUNTER — Other Ambulatory Visit: Payer: Self-pay

## 2016-01-31 ENCOUNTER — Other Ambulatory Visit: Payer: Self-pay | Admitting: Family Medicine

## 2016-01-31 DIAGNOSIS — Z1231 Encounter for screening mammogram for malignant neoplasm of breast: Secondary | ICD-10-CM

## 2016-02-22 ENCOUNTER — Ambulatory Visit
Admission: RE | Admit: 2016-02-22 | Discharge: 2016-02-22 | Disposition: A | Discharge status: Home / Self Care | Payer: BLUE CROSS/BLUE SHIELD | Source: Ambulatory Visit | Attending: Family Medicine | Admitting: Family Medicine

## 2016-02-22 ENCOUNTER — Other Ambulatory Visit: Payer: Self-pay | Admitting: Family Medicine

## 2016-02-22 DIAGNOSIS — Z1231 Encounter for screening mammogram for malignant neoplasm of breast: Secondary | ICD-10-CM

## 2017-06-30 DIAGNOSIS — M47812 Spondylosis without myelopathy or radiculopathy, cervical region: Secondary | ICD-10-CM | POA: Insufficient documentation

## 2018-06-02 DIAGNOSIS — M797 Fibromyalgia: Secondary | ICD-10-CM | POA: Insufficient documentation

## 2018-08-29 ENCOUNTER — Emergency Department: Payer: BLUE CROSS/BLUE SHIELD

## 2018-08-29 ENCOUNTER — Other Ambulatory Visit: Payer: Self-pay

## 2018-08-29 ENCOUNTER — Emergency Department
Admission: EM | Admit: 2018-08-29 | Discharge: 2018-08-29 | Disposition: A | Discharge status: Home / Self Care | Payer: BLUE CROSS/BLUE SHIELD | Attending: Emergency Medicine | Admitting: Emergency Medicine

## 2018-08-29 DIAGNOSIS — K5792 Diverticulitis of intestine, part unspecified, without perforation or abscess without bleeding: Secondary | ICD-10-CM

## 2018-08-29 DIAGNOSIS — K5790 Diverticulosis of intestine, part unspecified, without perforation or abscess without bleeding: Secondary | ICD-10-CM | POA: Diagnosis not present

## 2018-08-29 DIAGNOSIS — R1030 Lower abdominal pain, unspecified: Secondary | ICD-10-CM | POA: Diagnosis present

## 2018-08-29 LAB — BASIC METABOLIC PANEL
ANION GAP: 7 (ref 5–15)
BUN: 13 mg/dL (ref 6–20)
CALCIUM: 9.6 mg/dL (ref 8.9–10.3)
CHLORIDE: 101 mmol/L (ref 98–111)
CO2: 27 mmol/L (ref 22–32)
CREATININE: 0.78 mg/dL (ref 0.44–1.00)
GFR calc Af Amer: >60 mL/min (ref >60)
GLUCOSE: 114 mg/dL — AB (ref 70–99)
POTASSIUM: 4.2 mmol/L (ref 3.5–5.1)
Sodium: 135 mmol/L (ref 135–145)

## 2018-08-29 LAB — URINALYSIS, COMPLETE (UACMP) WITH MICROSCOPIC
Bilirubin Urine: NEGATIVE
GLUCOSE, UA: NEGATIVE mg/dL
KETONES UR: NEGATIVE mg/dL
LEUKOCYTES UA: NEGATIVE
Nitrite: NEGATIVE
Protein, ur: NEGATIVE mg/dL
SPECIFIC GRAVITY, URINE: 1.006 (ref 1.005–1.030)
pH: 8 (ref 5.0–8.0)

## 2018-08-29 LAB — CBC
HEMATOCRIT: 40.9 % (ref 36.0–46.0)
HEMOGLOBIN: 13.2 g/dL (ref 12.0–15.0)
MCH: 29.7 pg (ref 26.0–34.0)
MCHC: 32.3 g/dL (ref 30.0–36.0)
MCV: 91.9 fL (ref 80.0–100.0)
PLATELETS: 568 10*3/uL — AB (ref 150–400)
RBC: 4.45 MIL/uL (ref 3.87–5.11)
RDW: 13.7 % (ref 11.5–15.5)
WBC: 18.7 10*3/uL — ABNORMAL HIGH (ref 4.0–10.5)
nRBC: 0 % (ref 0.0–0.2)

## 2018-08-29 IMAGING — CT CT RENAL STONE PROTOCOL
2 of 4 series · 16 of 46 positions shown, 18 images · non-contrast
Comparison: CT scan of [DATE].

CLINICAL DATA: Flank pain.

EXAM:
CT ABDOMEN AND PELVIS WITHOUT CONTRAST
TECHNIQUE: Multidetector CT imaging of the abdomen and pelvis was performed
following the standard protocol without IV contrast.

[Series 2: stone full standard · axial · 0.66mm/px · z∈[-487,-67]mm · 13 of 94 slices shown, 15 images]
[im 5/94  soft-tissue]
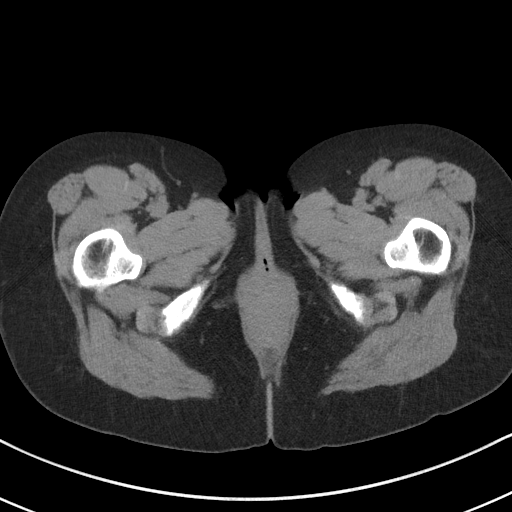
[im 5/94  bone]
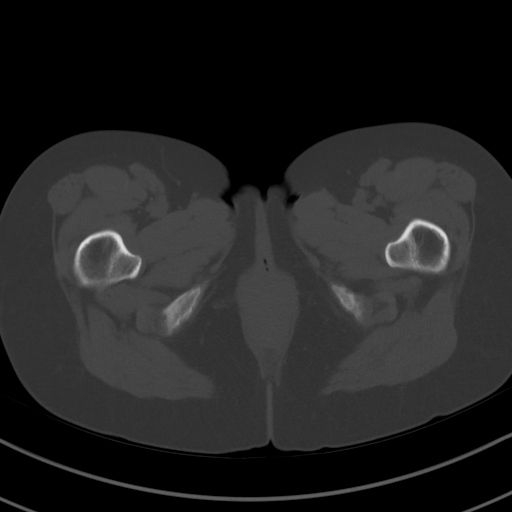
[im 13/94  soft-tissue]
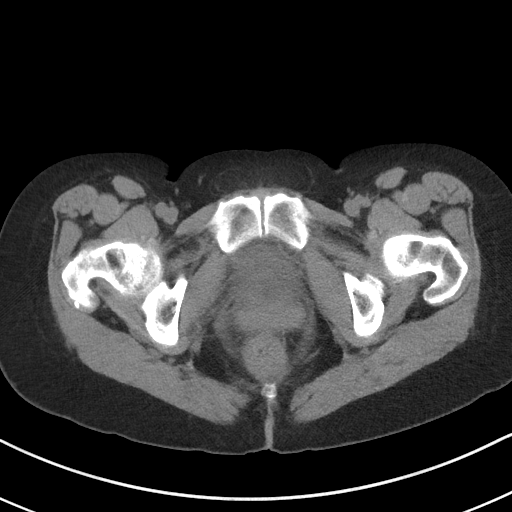
[im 21/94  soft-tissue]
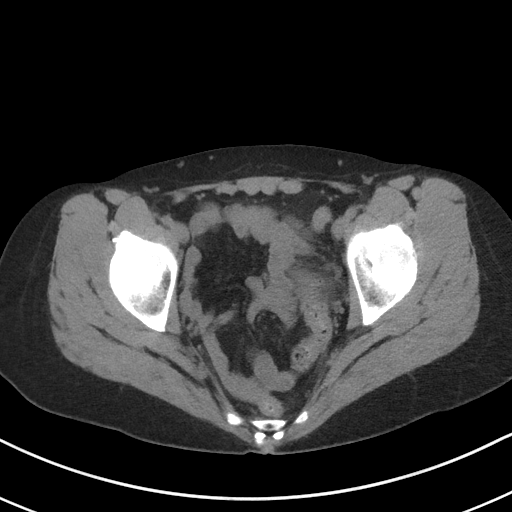
[im 25/94  soft-tissue]
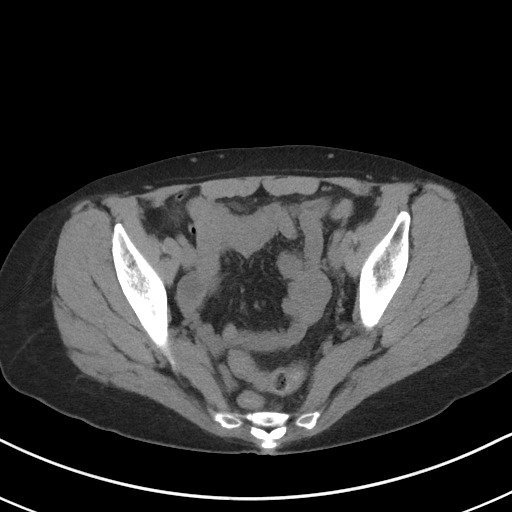
[im 33/94  soft-tissue]
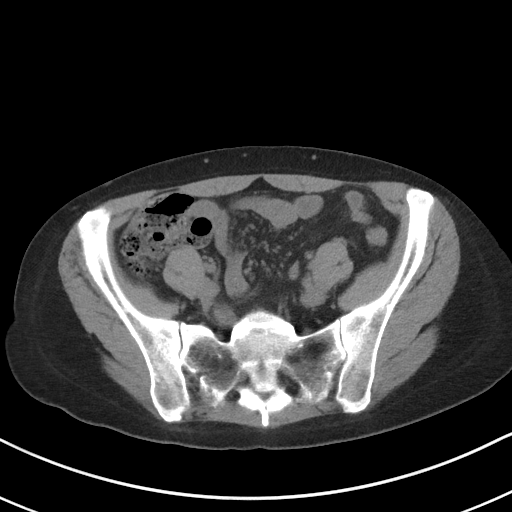
[im 41/94  soft-tissue]
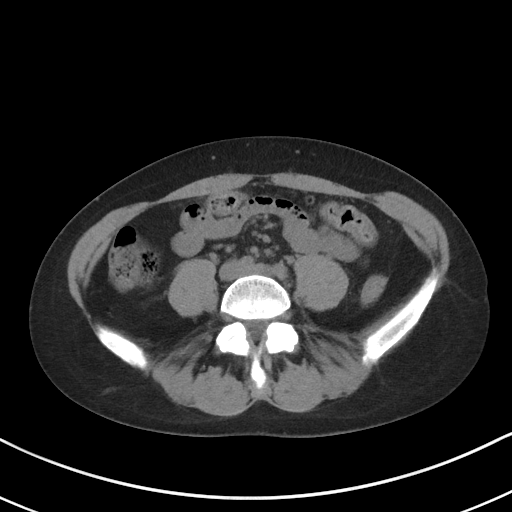
[im 49/94  soft-tissue]
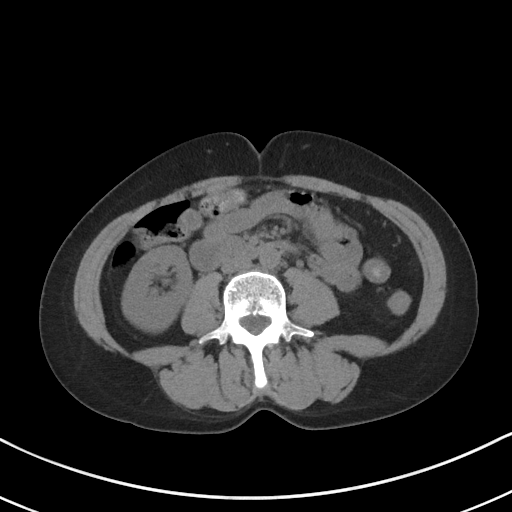
[im 53/94  soft-tissue]
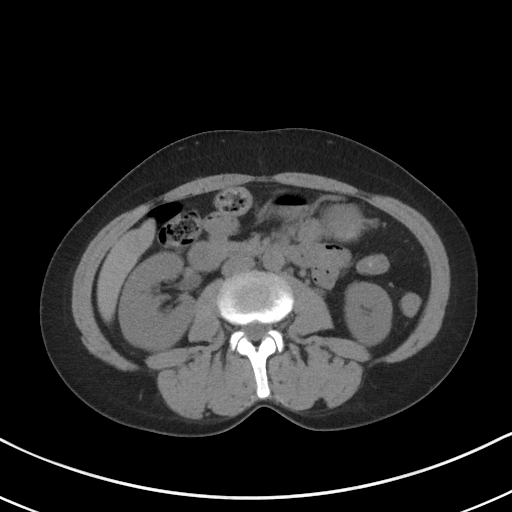
[im 61/94  soft-tissue]
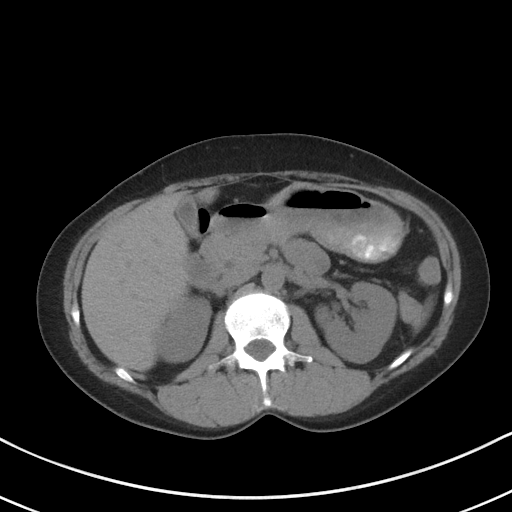
[im 61/94  bone]
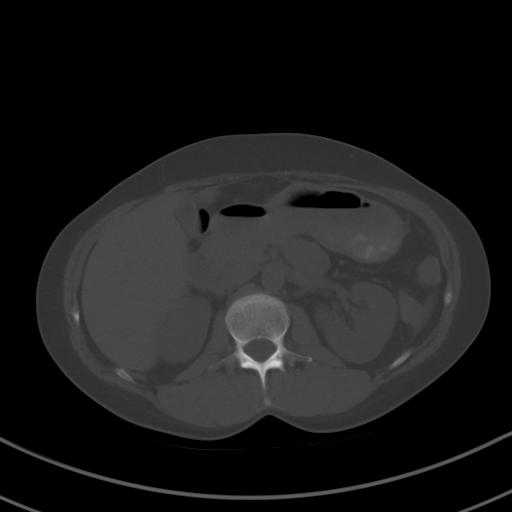
[im 69/94  soft-tissue]
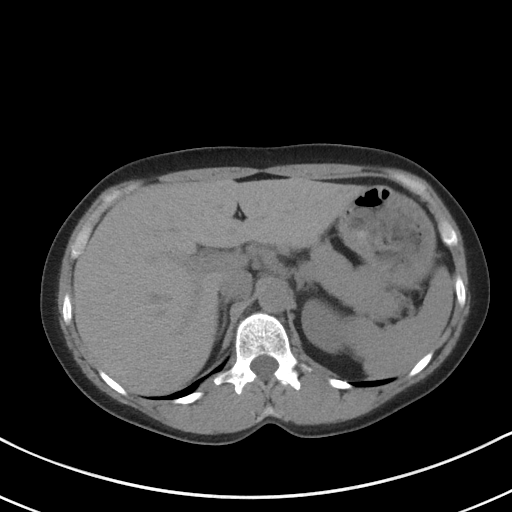
[im 73/94  soft-tissue]
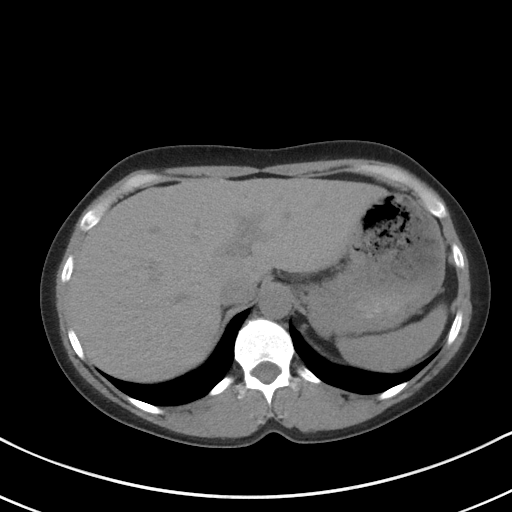
[im 81/94  soft-tissue]
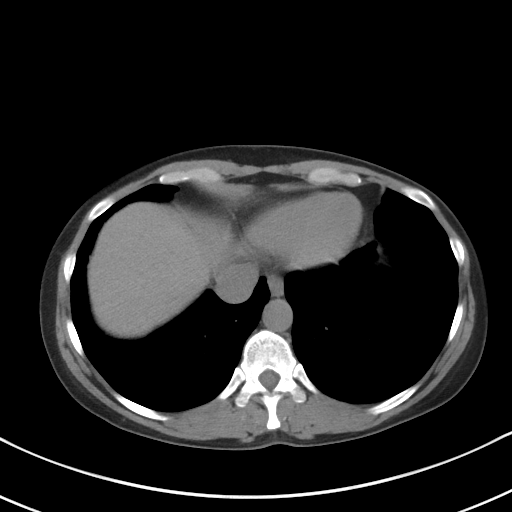
[im 89/94  soft-tissue]
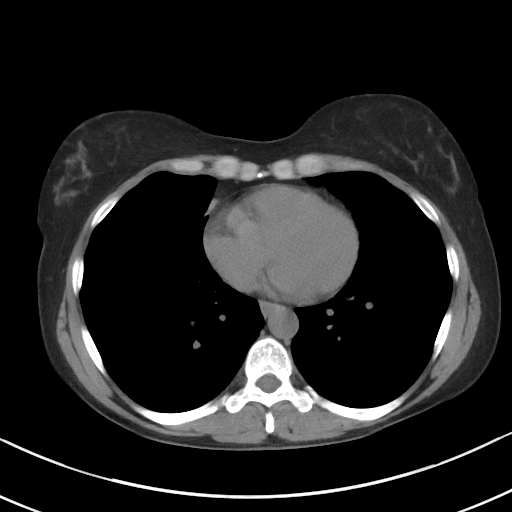

[Series 5: coronal · coronal · 0.63mm/px · 3 of 107 slices shown]
[im 36/107  soft-tissue]
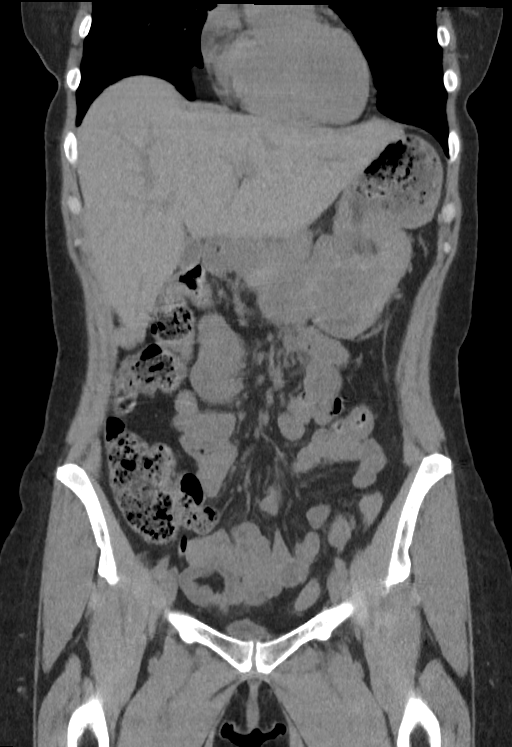
[im 48/107  soft-tissue]
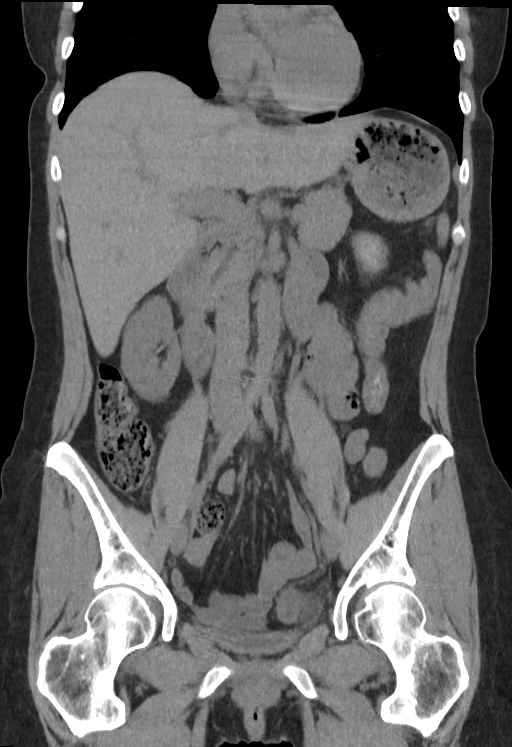
[im 59/107  soft-tissue]
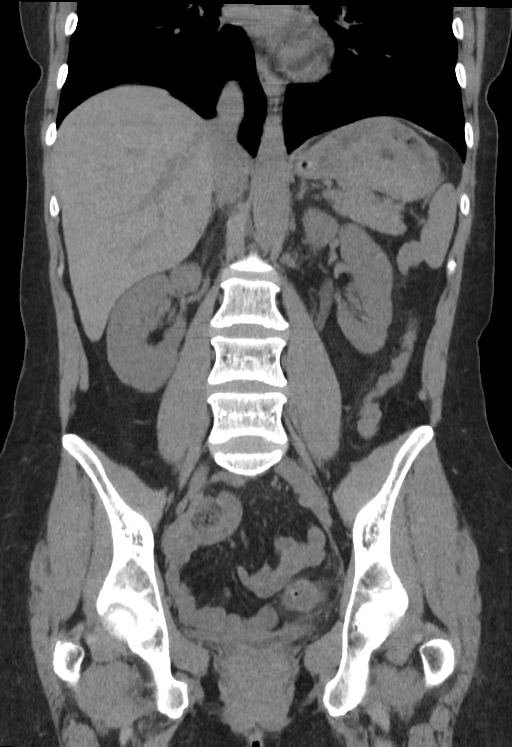

[16 of 46 positions shown; findings below may reference images not displayed]

FINDINGS: Lower chest: No acute abnormality.

Hepatobiliary: No focal liver abnormality is seen. No gallstones,
gallbladder wall thickening, or biliary dilatation.

Pancreas: Unremarkable. No pancreatic ductal dilatation or
surrounding inflammatory changes.

Spleen: Normal in size without focal abnormality.

Adrenals/Urinary Tract: Adrenal glands are unremarkable. Kidneys are
normal, without renal calculi, focal lesion, or hydronephrosis.
Bladder is unremarkable.

Stomach/Bowel: The stomach and appendix are unremarkable. There is
no evidence of bowel obstruction. Focal diverticulitis of proximal
sigmoid colon is noted without abscess formation.

Vascular/Lymphatic: No significant vascular findings are present. No
enlarged abdominal or pelvic lymph nodes.

Reproductive: Status post hysterectomy. No adnexal masses.

Other: No abdominal wall hernia or abnormality. No abdominopelvic
ascites.

Musculoskeletal: No acute or significant osseous findings.
IMPRESSION: Focal diverticulitis of proximal sigmoid colon without abscess
formation.

## 2018-08-29 MED ORDER — AMOXICILLIN-POT CLAVULANATE 875-125 MG PO TABS: 1.0000 | ORAL_TABLET | Freq: Two times a day (BID) | ORAL | 0 refills | Status: AC

## 2018-08-29 MED ORDER — TRAMADOL HCL 50 MG PO TABS: 50.0000 mg | ORAL_TABLET | Freq: Four times a day (QID) | ORAL | 0 refills | Status: DC | PRN

## 2018-08-29 MED ORDER — ACETAMINOPHEN-CODEINE #2 300-15 MG PO TABS: 1.0000 | ORAL_TABLET | ORAL | 0 refills | Status: DC | PRN

## 2018-08-29 NOTE — ED Notes (Signed)
Pt asking to have tylenol #3 instead of Tramadol, Dr. Cyril LoosenKinner aware.

## 2018-08-29 NOTE — ED Notes (Signed)
Dc instructions discussed with patient and informed pt to follow up with PCP. Pt verbalized understanding of this.

## 2018-08-29 NOTE — ED Triage Notes (Signed)
Pt comes via POV from home with c/o urinary symptoms. Pt states about 4 days ago it started and the pain has gotten worse.  Pt denies any burning or odor. Pt also states constipation. Pt also states migraine and nausea with some lower abdominal pain and right leg pain.

## 2018-08-29 NOTE — ED Provider Notes (Signed)
[facility] Emergency Department Provider Note   ____________________________________________    I have reviewed the triage vital signs and the nursing notes.   HISTORY  Chief Complaint Abdominal pain    HPI Cheryl Blackburn is a 42 y.o. female who presents with complaints of lower abdominal pain.  Patient reports a history of chronic neck pain, does report she had an epidural steroid injection performed 2 weeks ago.  Last week she reports she woke up in the middle of night with severe left lower abdominal pain which she has never had before.  She reports this improved somewhat but she is continued to have pain over the last week in the same area.  She also has some pain in her left lower back.  She reports significant urinary urgency.  She has attributed all this to her epidural.  Has had a abdominal hysterectomy, denies a history of kidney stones or diverticulitis.   History reviewed. No pertinent past medical history.  Patient Active Problem List   Diagnosis Date Noted  . TOBACCO USER 01/10/2010  . SKIN RASH 01/10/2010    Past Surgical History:  Procedure Laterality Date  . ABDOMINAL HYSTERECTOMY      Prior to Admission medications   Medication Sig Start Date End Date Taking? Authorizing Provider  amoxicillin-clavulanate (AUGMENTIN) 875-125 MG tablet Take 1 tablet by mouth 2 (two) times daily for 7 days. 08/29/18 09/05/18  Jene EveryKinner, Jamari Diana, MD  traMADol (ULTRAM) 50 MG tablet Take 1 tablet (50 mg total) by mouth every 6 (six) hours as needed. 08/29/18 08/29/19  Jene EveryKinner, Derry Kassel, MD     Allergies Clindamycin; Latex; and Metronidazole  Family History  Problem Relation Age of Onset  . Breast cancer Maternal Aunt        4 mat aunts    Social History No smoking or drug use  Review of Systems  Constitutional: No fever/chills Eyes: No visual changes.  ENT: Chronic neck pain Cardiovascular: Denies chest pain. Respiratory: Denies shortness of  breath. Gastrointestinal: As above, no nausea or vomiting Genitourinary: Negative for dysuria.  Urgency as above Musculoskeletal: Left lower back pain as above Skin: Negative for rash. Neurological: Negative for headaches or weakness   ____________________________________________   PHYSICAL EXAM:  VITAL SIGNS: ED Triage Vitals  Enc Vitals Group     BP 08/29/18 1315 (!) 143/82     Pulse Rate 08/29/18 1315 (!) 103     Resp 08/29/18 1315 14     Temp 08/29/18 1315 98.3 F (36.8 C)     Temp Source 08/29/18 1315 Oral     SpO2 08/29/18 1315 100 %     Weight 08/29/18 1315 54.9 kg (121 lb)     Height 08/29/18 1315 1.6 m (5\' 3" )     Head Circumference --      Peak Flow --      Pain Score 08/29/18 1321 9     Pain Loc --      Pain Edu? --      Excl. in GC? --     Constitutional: Alert and oriented. Eyes: Conjunctivae are normal.   Nose: No congestion/rhinnorhea. Mouth/Throat: Mucous membranes are moist.    Cardiovascular: Normal rate, regular rhythm.Peri [place].  Good peripheral circulation. Respiratory: Normal respiratory effort.  No retractions.  Gastrointestinal: Tenderness palpation the left lower quadrant, no distention, mild left CVA tenderness Musculoskeletal  Warm and well perfused Neurologic:  Normal speech and language. No gross focal neurologic deficits are appreciated.  Skin:  Skin is  warm, dry and intact.  Psychiatric: Mood and affect are normal. Speech and behavior are normal.  ____________________________________________   LABS (all labs ordered are listed, but only abnormal results are displayed)  Labs Reviewed  URINALYSIS, COMPLETE (UACMP) WITH MICROSCOPIC - Abnormal; Notable for the following components:      Result Value   Color, Urine STRAW (*)    APPearance CLEAR (*)    Hgb urine dipstick SMALL (*)    Bacteria, UA RARE (*)    All other components within normal limits  CBC - Abnormal; Notable for the following components:   WBC 18.7 (*)    Platelets 568 (*)     All other components within normal limits  BASIC METABOLIC PANEL - Abnormal; Notable for the following components:   Glucose, Bld 114 (*)    All other components within normal limits   ____________________________________________  EKG  None ____________________________________________  RADIOLOGY  CT renal stone study demonstrates sigmoid diverticulitis ____________________________________________   PROCEDURES  Procedure(s) performed: No  Procedures   Critical Care performed: No ____________________________________________   INITIAL IMPRESSION / ASSESSMENT AND PLAN / ED COURSE  Pertinent labs & imaging results that were available during my care of the patient were reviewed by me and considered in my medical decision making (see chart for details).  Patient presents with left lower quadrant abdominal pain, urinary urgency.  Urinalysis demonstrates mild blood but no white blood cells or bacteria to suggest UTI.  Differential includes ureterolithiasis, diverticulitis.  Doubt this is related to epidural although her elevated white blood cell count could be related to steroid injection.  Will obtain CT renal stone study, patient has denied pain medication at this time.  ----------------------------------------- 4:26 PM on 08/29/2018 -----------------------------------------  Patient with sigmoid diverticulitis on CT, this explains her symptoms.  She states she cannot tolerate Flagyl, will treat with Augmentin, tramadol, outpatient follow-up as needed.  Return precautions discussed   ____________________________________________   FINAL CLINICAL IMPRESSION(S) / ED DIAGNOSES  Final diagnoses:  Diverticulitis        Note:  This document was prepared using Dragon voice recognition software and may include unintentional dictation errors.    Jene EveryKinner, Adaya Garmany, MD 08/29/18 1626

## 2018-08-29 NOTE — ED Notes (Signed)
EDP in room at this time 

## 2018-09-06 ENCOUNTER — Inpatient Hospital Stay
Admission: EM | Admit: 2018-09-06 | Discharge: 2018-09-09 | DRG: 392 | Disposition: A | Discharge status: Home / Self Care | Payer: BLUE CROSS/BLUE SHIELD | Attending: Surgery | Admitting: Surgery

## 2018-09-06 ENCOUNTER — Encounter: Payer: Self-pay | Admitting: Radiology

## 2018-09-06 ENCOUNTER — Other Ambulatory Visit: Payer: Self-pay

## 2018-09-06 ENCOUNTER — Emergency Department: Payer: BLUE CROSS/BLUE SHIELD

## 2018-09-06 DIAGNOSIS — K5909 Other constipation: Secondary | ICD-10-CM | POA: Diagnosis present

## 2018-09-06 DIAGNOSIS — Z87891 Personal history of nicotine dependence: Secondary | ICD-10-CM | POA: Diagnosis not present

## 2018-09-06 DIAGNOSIS — Z90710 Acquired absence of both cervix and uterus: Secondary | ICD-10-CM

## 2018-09-06 DIAGNOSIS — R000 Tachycardia, unspecified: Secondary | ICD-10-CM | POA: Diagnosis present

## 2018-09-06 DIAGNOSIS — K219 Gastro-esophageal reflux disease without esophagitis: Secondary | ICD-10-CM | POA: Diagnosis present

## 2018-09-06 DIAGNOSIS — Z79891 Long term (current) use of opiate analgesic: Secondary | ICD-10-CM

## 2018-09-06 DIAGNOSIS — Z881 Allergy status to other antibiotic agents status: Secondary | ICD-10-CM

## 2018-09-06 DIAGNOSIS — I10 Essential (primary) hypertension: Secondary | ICD-10-CM | POA: Diagnosis present

## 2018-09-06 DIAGNOSIS — G894 Chronic pain syndrome: Secondary | ICD-10-CM | POA: Diagnosis present

## 2018-09-06 DIAGNOSIS — K5732 Diverticulitis of large intestine without perforation or abscess without bleeding: Principal | ICD-10-CM | POA: Diagnosis present

## 2018-09-06 DIAGNOSIS — K5792 Diverticulitis of intestine, part unspecified, without perforation or abscess without bleeding: Secondary | ICD-10-CM | POA: Diagnosis present

## 2018-09-06 LAB — URINALYSIS, COMPLETE (UACMP) WITH MICROSCOPIC
BACTERIA UA: NONE SEEN
BILIRUBIN URINE: NEGATIVE
GLUCOSE, UA: NEGATIVE mg/dL
Ketones, ur: NEGATIVE mg/dL
Leukocytes, UA: NEGATIVE
Nitrite: NEGATIVE
PROTEIN: NEGATIVE mg/dL
Specific Gravity, Urine: 1.002 — ABNORMAL LOW (ref 1.005–1.030)
pH: 7 (ref 5.0–8.0)

## 2018-09-06 LAB — COMPREHENSIVE METABOLIC PANEL
ALT: 13 U/L (ref 0–44)
ANION GAP: 8 (ref 5–15)
AST: 17 U/L (ref 15–41)
Albumin: 4.2 g/dL (ref 3.5–5.0)
Alkaline Phosphatase: 42 U/L (ref 38–126)
BILIRUBIN TOTAL: 0.6 mg/dL (ref 0.3–1.2)
BUN: 9 mg/dL (ref 6–20)
CALCIUM: 9.8 mg/dL (ref 8.9–10.3)
CO2: 26 mmol/L (ref 22–32)
Chloride: 101 mmol/L (ref 98–111)
Creatinine, Ser: 0.72 mg/dL (ref 0.44–1.00)
Glucose, Bld: 97 mg/dL (ref 70–99)
POTASSIUM: 3.5 mmol/L (ref 3.5–5.1)
Sodium: 135 mmol/L (ref 135–145)
TOTAL PROTEIN: 7.2 g/dL (ref 6.5–8.1)

## 2018-09-06 LAB — LIPASE, BLOOD: Lipase: 26 U/L (ref 11–51)

## 2018-09-06 LAB — CBC
HCT: 39.1 % (ref 36.0–46.0)
HEMOGLOBIN: 12.7 g/dL (ref 12.0–15.0)
MCH: 29.7 pg (ref 26.0–34.0)
MCHC: 32.5 g/dL (ref 30.0–36.0)
MCV: 91.6 fL (ref 80.0–100.0)
NRBC: 0 % (ref 0.0–0.2)
PLATELETS: 562 10*3/uL — AB (ref 150–400)
RBC: 4.27 MIL/uL (ref 3.87–5.11)
RDW: 13.2 % (ref 11.5–15.5)
WBC: 12.1 10*3/uL — AB (ref 4.0–10.5)

## 2018-09-06 IMAGING — CT CT ABD-PELV W/ CM
2 of 5 series · 15 of 46 positions shown, 17 images · IV contrast (omnipaque)
Comparison: CT [DATE]

CLINICAL DATA: Ongoing left lower quadrant pain and bloody
diarrhea. Diagnosed with diverticulitis 1 week ago, persistent pain
after completing antibiotics.

EXAM:
CT ABDOMEN AND PELVIS WITH CONTRAST
TECHNIQUE: Multidetector CT imaging of the abdomen and pelvis was performed
using the standard protocol following bolus administration of
intravenous contrast.
CONTRAST:  75mL OMNIPAQUE IOHEXOL 300 MG/ML  SOLN

[Series 2: routine abd/pel with · axial · 0.66mm/px · z∈[-1035,-635]mm · 12 of 90 slices shown, 14 images]
[im 5/90  soft-tissue]
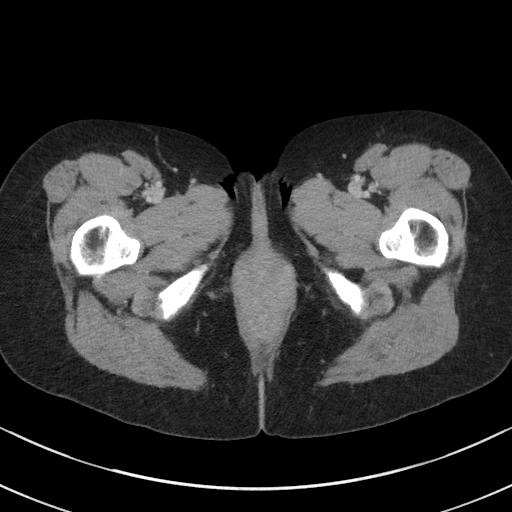
[im 5/90  bone]
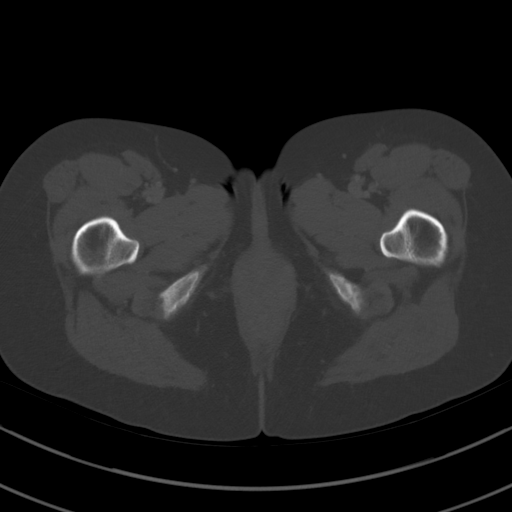
[im 15/90  soft-tissue]
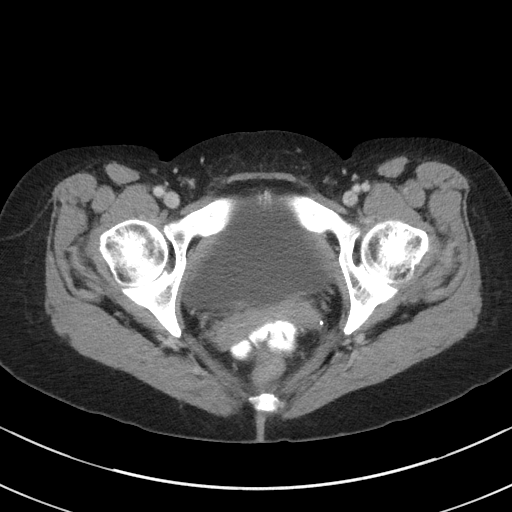
[im 19/90  soft-tissue]
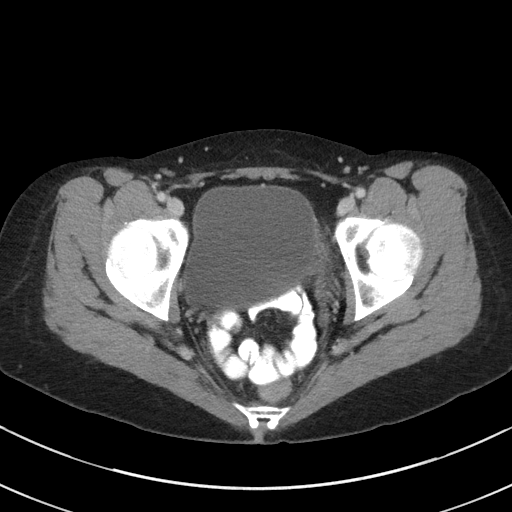
[im 29/90  soft-tissue]
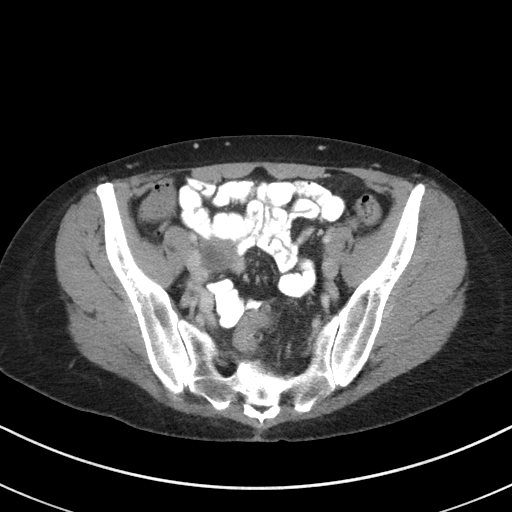
[im 33/90  soft-tissue]
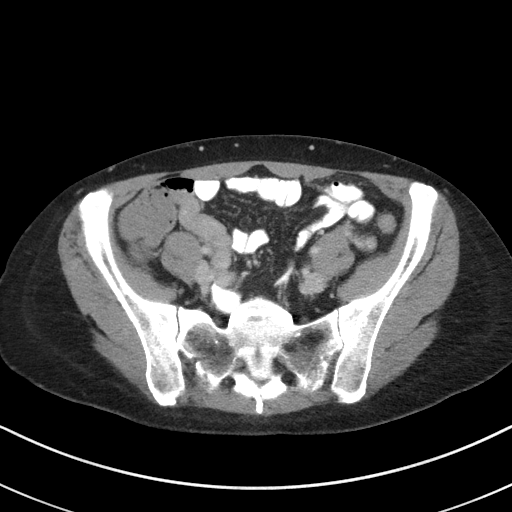
[im 43/90  soft-tissue]
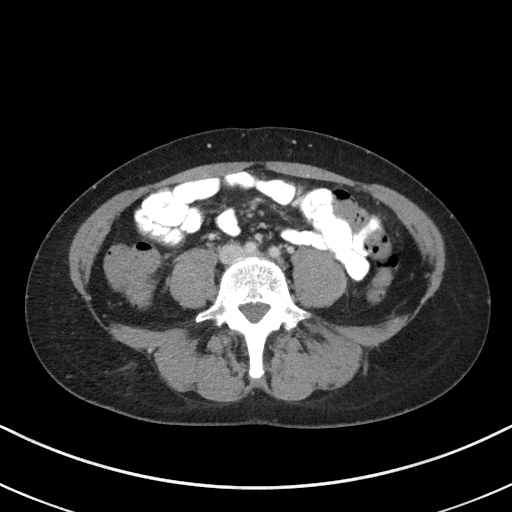
[im 47/90  soft-tissue]
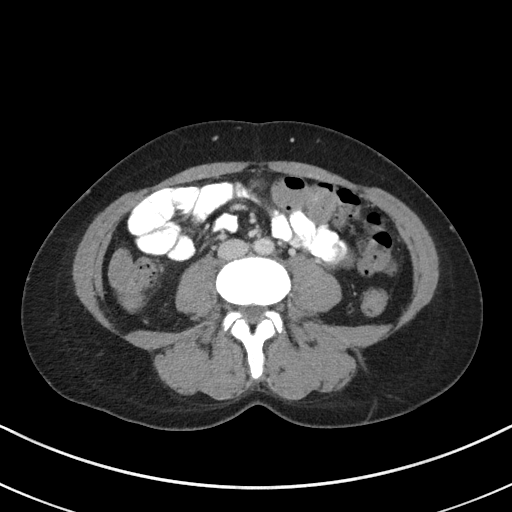
[im 57/90  soft-tissue]
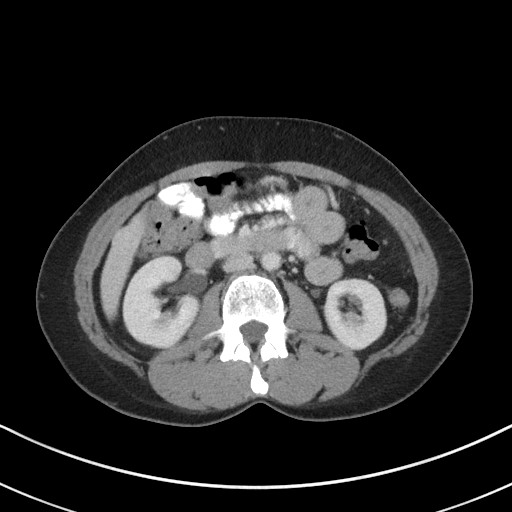
[im 61/90  soft-tissue]
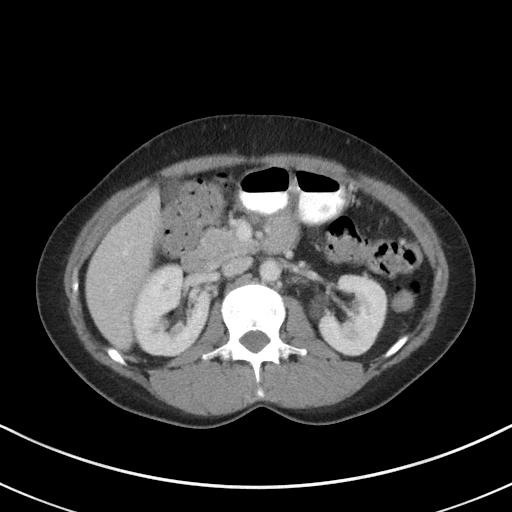
[im 61/90  bone]
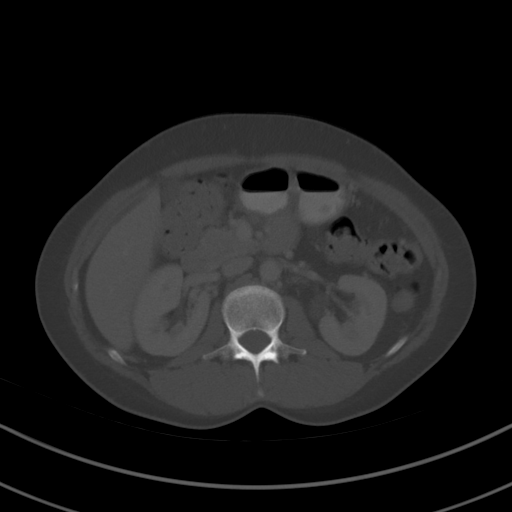
[im 71/90  soft-tissue]
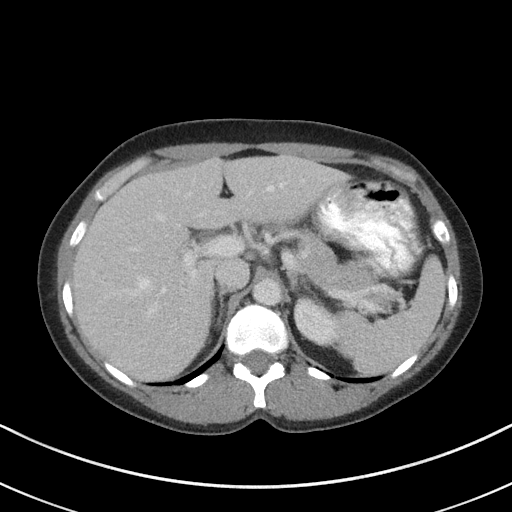
[im 75/90  soft-tissue]
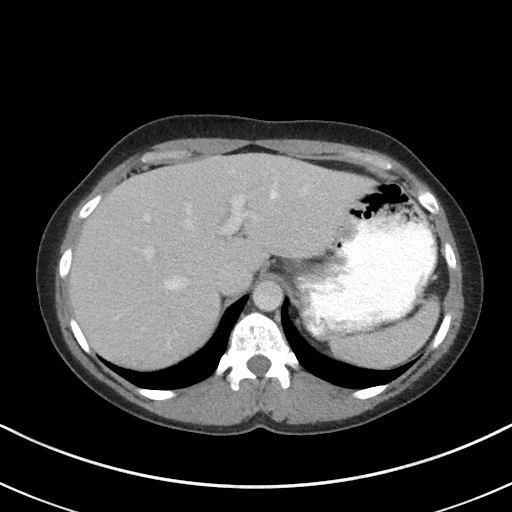
[im 85/90  soft-tissue]
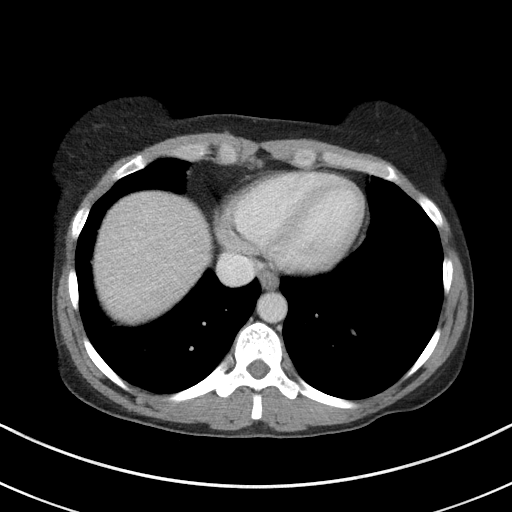

[Series 5: coronal st · coronal · 0.63mm/px · 3 of 70 slices shown]
[im 24/70  soft-tissue]
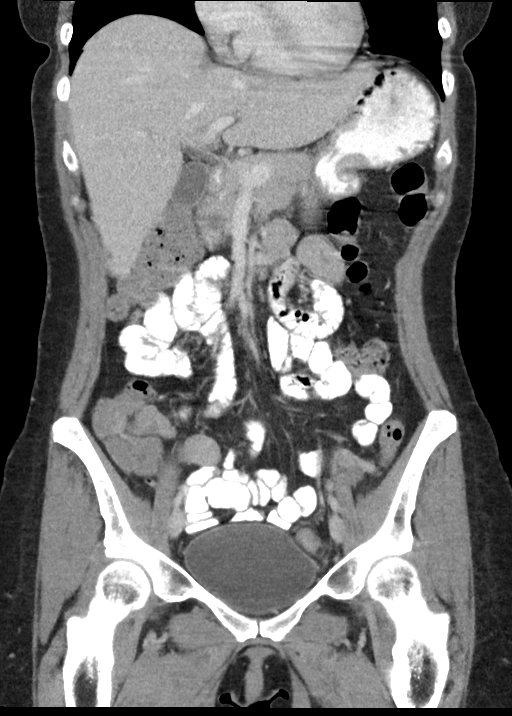
[im 31/70  soft-tissue]
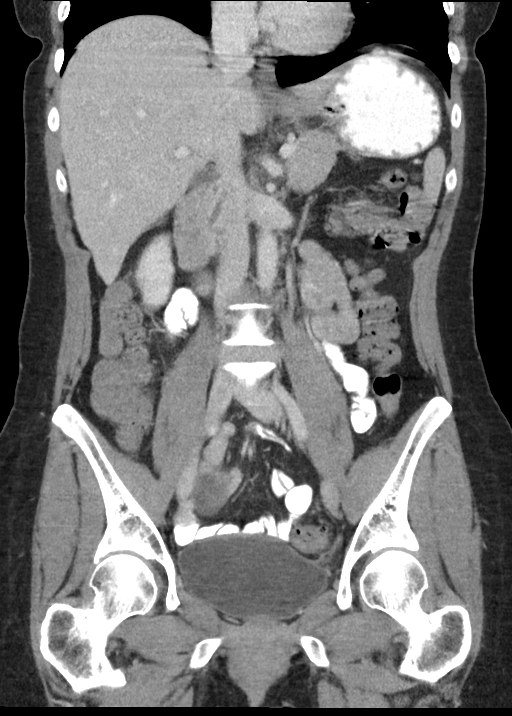
[im 39/70  soft-tissue]
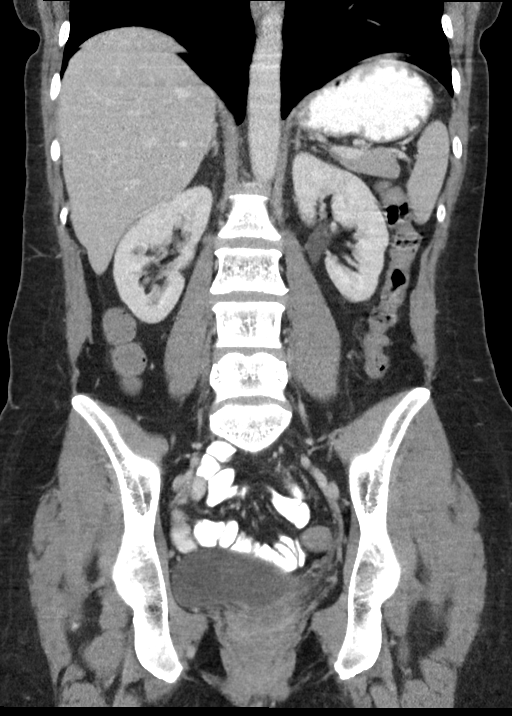

[15 of 46 positions shown; findings below may reference images not displayed]

FINDINGS: Lower chest: Mild hypoventilatory atelectasis in the right lower
lobe. No pleural fluid.

Hepatobiliary: Tiny subcapsular hypodensity in the right lobe of the
liver, too small to characterize. Gallbladder physiologically
distended, no calcified stone. No biliary dilatation.

Pancreas: No ductal dilatation or inflammation.

Spleen: Normal in size without focal abnormality.

Adrenals/Urinary Tract: Normal adrenal glands. No hydronephrosis or
perinephric edema. Homogeneous renal enhancement with symmetric
excretion on delayed phase imaging. Extrarenal pelvis configuration
of the right kidney. Urinary bladder is physiologically distended
without wall thickening.

Stomach/Bowel: Persistent colonic wall thickening and pericolonic
edema in the proximal sigmoid colon at site of prior diverticulitis.
The inflamed diverticulum has decreased in size, and now contains a
small focus of air. No abscess. Few additional noninflamed
diverticulum in the distal colon. No new sites of active
inflammation. Moderate stool burden in the more proximal colon.
Normal appendix. No small bowel inflammation, wall thickening, or
obstruction. No evidence of ileus.

Vascular/Lymphatic: Abdominal aorta is normal in caliber. Mesenteric
and portal veins are patent without thrombus. The IVC is
unremarkable. No adenopathy.

Reproductive: Post hysterectomy. Physiologic 2 cm cyst in the right
ovary. Left ovary tentatively identified and normal. No adnexal
mass.

Other: No abscess, free air, or free fluid. Tiny fat containing
umbilical hernia.

Musculoskeletal: There are no acute or suspicious osseous
abnormalities.
IMPRESSION: Persistent diverticulitis of the proximal sigmoid colon. Overall
improvement in inflammatory change from prior exam with residual
colonic wall thickening and pericolonic/peri diverticular edema. No
abscess or perforation.

No new abnormalities.

## 2018-09-06 MED ORDER — HYDRALAZINE HCL 20 MG/ML IJ SOLN: 10.0000 mg | INTRAMUSCULAR | Status: DC | PRN

## 2018-09-06 MED ORDER — ACETAMINOPHEN 650 MG RE SUPP: 650.0000 mg | Freq: Four times a day (QID) | RECTAL | Status: DC | PRN

## 2018-09-06 MED ORDER — TRAMADOL HCL 50 MG PO TABS
50.0000 mg | ORAL_TABLET | Freq: Four times a day (QID) | ORAL | Status: DC | PRN
  Administered 2018-09-09: 50 mg via ORAL
  Filled 2018-09-06 (×2): qty 1

## 2018-09-06 MED ORDER — PIPERACILLIN-TAZOBACTAM 3.375 G IVPB
3.3750 g | Freq: Three times a day (TID) | INTRAVENOUS | Status: DC
  Administered 2018-09-06 – 2018-09-07 (×2): 3.375 g via INTRAVENOUS
  Filled 2018-09-06 (×2): qty 50

## 2018-09-06 MED ORDER — ACETAMINOPHEN 325 MG PO TABS: 650.0000 mg | ORAL_TABLET | Freq: Four times a day (QID) | ORAL | Status: DC | PRN

## 2018-09-06 MED ORDER — ENOXAPARIN SODIUM 40 MG/0.4ML ~~LOC~~ SOLN
40.0000 mg | SUBCUTANEOUS | Status: DC
  Administered 2018-09-06: 40 mg via SUBCUTANEOUS
  Filled 2018-09-06 (×2): qty 0.4

## 2018-09-06 MED ORDER — ONDANSETRON HCL 4 MG PO TABS: 4.0000 mg | ORAL_TABLET | Freq: Four times a day (QID) | ORAL | Status: DC | PRN

## 2018-09-06 MED ORDER — PIPERACILLIN-TAZOBACTAM 3.375 G IVPB 30 MIN: 3.3750 g | Freq: Four times a day (QID) | INTRAVENOUS | Status: DC

## 2018-09-06 MED ORDER — MORPHINE SULFATE (PF) 2 MG/ML IV SOLN
2.0000 mg | INTRAVENOUS | Status: DC | PRN
  Administered 2018-09-07 – 2018-09-08 (×3): 2 mg via INTRAVENOUS
  Filled 2018-09-06 (×3): qty 1

## 2018-09-06 MED ORDER — IOHEXOL 300 MG/ML  SOLN
75.0000 mL | Freq: Once | INTRAMUSCULAR | Status: AC | PRN
  Administered 2018-09-06: 75 mL via INTRAVENOUS
  Filled 2018-09-06: qty 75

## 2018-09-06 MED ORDER — METOPROLOL TARTRATE 25 MG PO TABS
25.0000 mg | ORAL_TABLET | Freq: Two times a day (BID) | ORAL | Status: DC
  Administered 2018-09-06 – 2018-09-09 (×5): 25 mg via ORAL
  Filled 2018-09-06 (×5): qty 1

## 2018-09-06 MED ORDER — POTASSIUM CHLORIDE IN NACL 20-0.45 MEQ/L-% IV SOLN
INTRAVENOUS | Status: DC
  Administered 2018-09-06 – 2018-09-07 (×3): via INTRAVENOUS
  Filled 2018-09-06 (×6): qty 1000

## 2018-09-06 MED ORDER — SODIUM CHLORIDE 0.9 % IV BOLUS
1000.0000 mL | Freq: Once | INTRAVENOUS | Status: AC
  Administered 2018-09-06: 1000 mL via INTRAVENOUS

## 2018-09-06 MED ORDER — ONDANSETRON HCL 4 MG/2ML IJ SOLN
4.0000 mg | Freq: Four times a day (QID) | INTRAMUSCULAR | Status: DC | PRN
  Administered 2018-09-07 – 2018-09-08 (×4): 4 mg via INTRAVENOUS
  Filled 2018-09-06 (×4): qty 2

## 2018-09-06 MED ORDER — IOPAMIDOL (ISOVUE-300) INJECTION 61%
15.0000 mL | INTRAVENOUS | Status: AC
  Administered 2018-09-06 (×2): 15 mL via ORAL
  Filled 2018-09-06 (×2): qty 15

## 2018-09-06 NOTE — ED Triage Notes (Signed)
Pt seen and treated for diverticulitis a week ago, states that she has completed the augmentin and cont to have pain

## 2018-09-06 NOTE — Progress Notes (Signed)
Pharmacy Antibiotic Note  Raley Kaller is a 43 y.o. female admitted on 09/06/2018 with intra-abdominal infx.  Pharmacy has been consulted for zosyn dosing.  Plan: Zosyn 3.375g IV q8h (4 hour infusion).  Height: 5\' 3"  (160 cm) Weight: 121 lb (54.9 kg) IBW/kg (Calculated) : 52.4  Temp (24hrs), Avg:98.4 F (36.9 C), Min:98.4 F (36.9 C), Max:98.4 F (36.9 C)  Recent Labs  Lab 09/06/18 1508  WBC 12.1*  CREATININE 0.72    Estimated Creatinine Clearance: 74.2 mL/min (by C-G formula based on SCr of 0.72 mg/dL).    Allergies  Allergen Reactions  . Clindamycin   . Latex   . Metronidazole     Thank you for allowing pharmacy to be a part of this patient's care.  Thomasene Ripple, PharmD, BCPS Clinical Pharmacist 09/06/2018

## 2018-09-06 NOTE — ED Provider Notes (Signed)
[facility] Emergency Department Provider Note   ____________________________________________   First MD Initiated Contact with Patient 09/06/18 1832     (approximate)  I have reviewed the triage vital signs and the nursing notes.   HISTORY  Chief Complaint Abdominal Pain    HPI Cheryl Blackburn is a 43 y.o. female reports that she had diverticulitis a week ago.  She came back because she is continued to have discomfort and pain in the left lower abdomen.  She reports that the pain seems to be slowly improved, but she notices that she is continued to have 1-2 loose stools a day, and also ongoing pain in the left lower abdomen.  No fevers or chills.  She is taken a total of 7 days of Augmentin which she completed.  No vomiting, decreased appetite though not really feeling nauseated.  She reports she does not really feel like eating much.  She is able to keep food down.  Reports she is concerned that her symptoms are ongoing, does not feel like the infection is fully gone away.  Also reports that she is noticed for the whole week that whenever she urinates she has a feeling of urgency with it, does report a history of interstitial cystitis and thinks that may have caused some inflammation with that as well  She does report that she has an allergy to clindamycin and Flagyl, but reports is not really an allergy that causes an allergic reaction but rather she just does not tolerate it Auberry very well and it is very upsetting to her stomach.  She has a history of C. difficile once about 10 years ago after taking clindamycin   History reviewed. No pertinent past medical history.  Patient Active Problem List   Diagnosis Date Noted  . TOBACCO USER 01/10/2010  . SKIN RASH 01/10/2010    Past Surgical History:  Procedure Laterality Date  . ABDOMINAL HYSTERECTOMY      Prior to Admission medications   Medication Sig Start Date End Date Taking? Authorizing Provider    acetaminophen-codeine (TYLENOL #2) 300-15 MG tablet Take 1 tablet by mouth every 4 (four) hours as needed for moderate pain. 08/29/18 08/29/19  Jene Every, MD  traMADol (ULTRAM) 50 MG tablet Take 1 tablet (50 mg total) by mouth every 6 (six) hours as needed. 08/29/18 08/29/19  Jene Every, MD    Allergies Clindamycin; Latex; and Metronidazole  Family History  Problem Relation Age of Onset  . Breast cancer Maternal Aunt        4 mat aunts    Social History Social History   Tobacco Use  . Smoking status: Not on file  Substance Use Topics  . Alcohol use: Not on file  . Drug use: Not on file    Review of Systems Constitutional: No fever/chills Eyes: No visual changes. ENT: No sore throat. Cardiovascular: Denies chest pain. Respiratory: Denies shortness of breath. Gastrointestinal: See HPI Genitourinary: The HPI.  Prior hysterectomy Musculoskeletal: Negative for back pain. Skin: Negative for rash. Neurological: Negative for headaches, areas of focal weakness or numbness.    ____________________________________________   PHYSICAL EXAM:  VITAL SIGNS: ED Triage Vitals  Enc Vitals Group     BP 09/06/18 1501 (!) 153/88     Pulse Rate 09/06/18 1501 (!) 119     Resp 09/06/18 1501 18     Temp 09/06/18 1501 98.4 F (36.9 C)     Temp Source 09/06/18 1501 Oral     SpO2 09/06/18  1501 100 %     Weight 09/06/18 1502 121 lb (54.9 kg)     Height 09/06/18 1502 5\' 3"  (1.6 m)     Head Circumference --      Peak Flow --      Pain Score 09/06/18 1502 8     Pain Loc --      Pain Edu? --      Excl. in GC? --     Constitutional: Alert and oriented.  Mildly ill-appearing, very pleasant and in no distress. Eyes: Conjunctivae are normal. Head: Atraumatic. Nose: No congestion/rhinnorhea. Mouth/Throat: Mucous membranes are moist. Neck: No stridor.  Cardiovascular: Just slightly tachycardic rate, regular rhythm. Grossly normal heart sounds.  Good peripheral  circulation. Respiratory: Normal respiratory effort.  No retractions. Lungs CTAB. Gastrointestinal: Soft and nontender except for focal discomfort in the left lower quadrant without rebound or guarding. No distention. Musculoskeletal: No lower extremity tenderness nor edema. Neurologic:  Normal speech and language. No gross focal neurologic deficits are appreciated.  Skin:  Skin is warm, dry and intact. No rash noted. Psychiatric: Mood and affect are normal. Speech and behavior are normal.  ____________________________________________   LABS (all labs ordered are listed, but only abnormal results are displayed)  Labs Reviewed  CBC - Abnormal; Notable for the following components:      Result Value   WBC 12.1 (*)    Platelets 562 (*)    All other components within normal limits  URINALYSIS, COMPLETE (UACMP) WITH MICROSCOPIC - Abnormal; Notable for the following components:   Color, Urine COLORLESS (*)    APPearance CLEAR (*)    Specific Gravity, Urine 1.002 (*)    Hgb urine dipstick SMALL (*)    All other components within normal limits  GASTROINTESTINAL PANEL BY PCR, STOOL (REPLACES STOOL CULTURE)  C DIFFICILE QUICK SCREEN W PCR REFLEX  LIPASE, BLOOD  COMPREHENSIVE METABOLIC PANEL  POC URINE PREG, ED   ____________________________________________  EKG   ____________________________________________  RADIOLOGY  Discussed risks and benefits of repeat CT scan with the patient, offered potential change in antibiotics and ongoing therapy for what appears to be ongoing diverticulitis.  Patient reports that she would like to be certain she has not developed a complication such as an abscess after we discussed.  We will proceed with CT scan to further evaluate.  Ct Abdomen Pelvis W Contrast  Result Date: 09/06/2018 CLINICAL DATA:  Ongoing left lower quadrant pain and bloody diarrhea. Diagnosed with diverticulitis 1 week ago, persistent pain after completing antibiotics. EXAM: CT  ABDOMEN AND PELVIS WITH CONTRAST TECHNIQUE: Multidetector CT imaging of the abdomen and pelvis was performed using the standard protocol following bolus administration of intravenous contrast. CONTRAST:  75mL OMNIPAQUE IOHEXOL 300 MG/ML  SOLN COMPARISON:  CT 10/30/2017 FINDINGS: Lower chest: Mild hypoventilatory atelectasis in the right lower lobe. No pleural fluid. Hepatobiliary: Tiny subcapsular hypodensity in the right lobe of the liver, too small to characterize. Gallbladder physiologically distended, no calcified stone. No biliary dilatation. Pancreas: No ductal dilatation or inflammation. Spleen: Normal in size without focal abnormality. Adrenals/Urinary Tract: Normal adrenal glands. No hydronephrosis or perinephric edema. Homogeneous renal enhancement with symmetric excretion on delayed phase imaging. Extrarenal pelvis configuration of the right kidney. Urinary bladder is physiologically distended without wall thickening. Stomach/Bowel: Persistent colonic wall thickening and pericolonic edema in the proximal sigmoid colon at site of prior diverticulitis. The inflamed diverticulum has decreased in size, and now contains a small focus of air. No abscess. Few additional noninflamed diverticulum in  the distal colon. No new sites of active inflammation. Moderate stool burden in the more proximal colon. Normal appendix. No small bowel inflammation, wall thickening, or obstruction. No evidence of ileus. Vascular/Lymphatic: Abdominal aorta is normal in caliber. Mesenteric and portal veins are patent without thrombus. The IVC is unremarkable. No adenopathy. Reproductive: Post hysterectomy. Physiologic 2 cm cyst in the right ovary. Left ovary tentatively identified and normal. No adnexal mass. Other: No abscess, free air, or free fluid. Tiny fat containing umbilical hernia. Musculoskeletal: There are no acute or suspicious osseous abnormalities. IMPRESSION: Persistent diverticulitis of the proximal sigmoid colon.  Overall improvement in inflammatory change from prior exam with residual colonic wall thickening and pericolonic/peri diverticular edema. No abscess or perforation. No new abnormalities. Electronically Signed   By: Narda Rutherford M.D.   On: 09/06/2018 20:58    ____________________________________________   PROCEDURES  Procedure(s) performed: None  Procedures  Critical Care performed: No  ____________________________________________   INITIAL IMPRESSION / ASSESSMENT AND PLAN / ED COURSE  Pertinent labs & imaging results that were available during my care of the patient were reviewed by me and considered in my medical decision making (see chart for details).   Differential diagnosis includes but is not limited to, abdominal perforation, aortic dissection, cholecystitis, appendicitis, diverticulitis, colitis, esophagitis/gastritis, kidney stone, pyelonephritis, urinary tract infection, aortic aneurysm. All are considered in decision and treatment plan. Based upon the patient's presentation and risk factors, I suspect her infection is slowly improving, her white count is improved but she continues to have focal discomfort in the left lower quadrant and does report ongoing loose stools.  She reports 1-2 loose stools a day, I suspect this would not be C. difficile and she does not think she could provide a stool sample now but I suspect possibly ongoing diverticulitis, given discussion with shared medical decision making about a repeat CT scan the patient would like to proceed with further evaluation by CT scan.  Will hydrate, obtain CT abdomen pelvis to further evaluate for possible complication or ongoing diverticulitis or other cause of left lower quadrant pain today.     ----------------------------------------- 9:40 PM on 09/06/2018 -----------------------------------------  Patient reports she is feeling pretty comfortable does not wish for any pain or nausea medication.  Still  discomfort the left lower quadrant.  No loose stools here.  Discussed with Dr. Franchot Heidelberg of GI and she reports the patient should be treated with IV antibiotic and admitted for treatment failure.  Discussed with the patient, she is in agreement.  Case discussed with Dr. Katheren Shams for admission.  GI will provide inpatient consultation tomorrow  ____________________________________________   FINAL CLINICAL IMPRESSION(S) / ED DIAGNOSES  Final diagnoses:  Diverticulitis of large intestine, unspecified bleeding status, unspecified complication status        Note:  This document was prepared using Dragon voice recognition software and may include unintentional dictation errors       Sharyn Creamer, MD 09/06/18 2141

## 2018-09-06 NOTE — Progress Notes (Signed)
Family Meeting Note  Advance Directive:yes  Today a meeting took place with the Patient.  Patient is able to participate   The following clinical team members were present during this meeting:MD  The following were discussed:Patient's diagnosis: Diverticulitis, Patient's progosis: Unable to determine and Goals for treatment: Full Code  Additional follow-up to be provided: prn  Time spent during discussion:20 minutes  Krista Som D Shamarion Coots, MD  

## 2018-09-06 NOTE — H&P (Signed)
[facility] - [place] at [facility]   PATIENT NAME: Cheryl Blackburn    MR#:  [id number]  DATE OF BIRTH:  [birth year]  DATE OF ADMISSION:  09/06/2018  PRIMARY CARE PHYSICIAN: [facility], [facility]   REQUESTING/REFERRING PHYSICIAN:   CHIEF COMPLAINT:   Chief Complaint  Patient presents with  . Abdominal Pain    HISTORY OF PRESENT ILLNESS: Cheryl Blackburn  is a 43 y.o. female with a known history per below which includes recent diagnosis of diverticulitis, treated with Augmentin over the last 7 days with no improvement, presented emergency room with continued abdominal pain with nausea, generalized weakness, fatigue, and emergency room patient noted to be tachycardic, white count 12,000, CT abdomen noted for sigmoid diverticulitis, patient valuated emergency room, in mild discomfort due to pain, patient is now been admitted for acute sigmoid diverticulitis which is failed outpatient management.  PAST MEDICAL HISTORY:   Diverticulitis  PAST SURGICAL HISTORY:  Past Surgical History:  Procedure Laterality Date  . ABDOMINAL HYSTERECTOMY      SOCIAL HISTORY:  Social History   Tobacco Use  . Smoking status: Not on file  Substance Use Topics  . Alcohol use: Not on file    FAMILY HISTORY:  Family History  Problem Relation Age of Onset  . Breast cancer Maternal Aunt        4 mat aunts    DRUG ALLERGIES:  Allergies  Allergen Reactions  . Clindamycin   . Latex   . Metronidazole     REVIEW OF SYSTEMS:   CONSTITUTIONAL: No fever, fatigue or weakness.  EYES: No blurred or double vision.  EARS, NOSE, AND THROAT: No tinnitus or ear pain.  RESPIRATORY: No cough, shortness of breath, wheezing or hemoptysis.  CARDIOVASCULAR: No chest pain, orthopnea, edema.  GASTROINTESTINAL: + nausea, vomiting, abdominal pain.  GENITOURINARY: No dysuria, hematuria.  ENDOCRINE: No polyuria, nocturia,  HEMATOLOGY: No anemia, easy bruising or bleeding SKIN: No rash or  lesion. MUSCULOSKELETAL: No joint pain or arthritis.   NEUROLOGIC: No tingling, numbness, weakness.  PSYCHIATRY: No anxiety or depression.   MEDICATIONS AT HOME:  Prior to Admission medications   Medication Sig Start Date End Date Taking? Authorizing Provider  acetaminophen-codeine (TYLENOL #2) 300-15 MG tablet Take 1 tablet by mouth every 4 (four) hours as needed for moderate pain. 08/29/18 08/29/19  Jene Every, MD  traMADol (ULTRAM) 50 MG tablet Take 1 tablet (50 mg total) by mouth every 6 (six) hours as needed. 08/29/18 08/29/19  Jene Every, MD      PHYSICAL EXAMINATION:   VITAL SIGNS: Blood pressure (!) 153/88, pulse (!) 119, temperature 98.4 F (36.9 C), temperature source Oral, resp. rate 18, height 5\' 3"  (1.6 m), weight 54.9 kg, SpO2 100 %.  GENERAL:  43 y.o.-year-old patient lying in the bed with no acute distress.  EYES: Pupils equal, round, reactive to light and accommodation. No scleral icterus. Extraocular muscles intact.  HEENT: Head atraumatic, normocephalic. Oropharynx and nasopharynx clear.  NECK:  Supple, no jugular venous distention. No thyroid enlargement, no tenderness.  LUNGS: Normal breath sounds bilaterally, no wheezing, rales,rhonchi or crepitation. No use of accessory muscles of respiration.  CARDIOVASCULAR: S1, S2 normal. No murmurs, rubs, or gallops.  ABDOMEN: Acute abdominal tenderness without rebound/guarding Bowel sounds present. No organomegaly or mass.  EXTREMITIES: No pedal edema, cyanosis, or clubbing.  NEUROLOGIC: Cranial nerves II through XII are intact. MAES. Gait not checked.  PSYCHIATRIC: The patient is alert and oriented x 3.  SKIN: No obvious rash,  lesion, or ulcer.   LABORATORY PANEL:   CBC Recent Labs  Lab 09/06/18 1508  WBC 12.1*  HGB 12.7  HCT 39.1  PLT 562*  MCV 91.6  MCH 29.7  MCHC 32.5  RDW 13.2    ------------------------------------------------------------------------------------------------------------------  Chemistries  Recent Labs  Lab 09/06/18 1508  NA 135  K 3.5  CL 101  CO2 26  GLUCOSE 97  BUN 9  CREATININE 0.72  CALCIUM 9.8  AST 17  ALT 13  ALKPHOS 42  BILITOT 0.6   ------------------------------------------------------------------------------------------------------------------ estimated creatinine clearance is 74.2 mL/min (by C-G formula based on SCr of 0.72 mg/dL). ------------------------------------------------------------------------------------------------------------------ No results for input(s): TSH, T4TOTAL, T3FREE, THYROIDAB in the last 72 hours.  Invalid input(s): FREET3   Coagulation profile No results for input(s): INR, PROTIME in the last 168 hours. ------------------------------------------------------------------------------------------------------------------- No results for input(s): DDIMER in the last 72 hours. -------------------------------------------------------------------------------------------------------------------  Cardiac Enzymes No results for input(s): CKMB, TROPONINI, MYOGLOBIN in the last 168 hours.  Invalid input(s): CK ------------------------------------------------------------------------------------------------------------------ Invalid input(s): POCBNP  ---------------------------------------------------------------------------------------------------------------  Urinalysis    Component Value Date/Time   COLORURINE COLORLESS (A) 09/06/2018 1508   APPEARANCEUR CLEAR (A) 09/06/2018 1508   LABSPEC 1.002 (L) 09/06/2018 1508   PHURINE 7.0 09/06/2018 1508   GLUCOSEU NEGATIVE 09/06/2018 1508   HGBUR SMALL (A) 09/06/2018 1508   BILIRUBINUR NEGATIVE 09/06/2018 1508   KETONESUR NEGATIVE 09/06/2018 1508   PROTEINUR NEGATIVE 09/06/2018 1508   NITRITE NEGATIVE 09/06/2018 1508   LEUKOCYTESUR NEGATIVE 09/06/2018  1508     RADIOLOGY: Ct Abdomen Pelvis W Contrast  Result Date: 09/06/2018 CLINICAL DATA:  Ongoing left lower quadrant pain and bloody diarrhea. Diagnosed with diverticulitis 1 week ago, persistent pain after completing antibiotics. EXAM: CT ABDOMEN AND PELVIS WITH CONTRAST TECHNIQUE: Multidetector CT imaging of the abdomen and pelvis was performed using the standard protocol following bolus administration of intravenous contrast. CONTRAST:  75mL OMNIPAQUE IOHEXOL 300 MG/ML  SOLN COMPARISON:  CT 10/30/2017 FINDINGS: Lower chest: Mild hypoventilatory atelectasis in the right lower lobe. No pleural fluid. Hepatobiliary: Tiny subcapsular hypodensity in the right lobe of the liver, too small to characterize. Gallbladder physiologically distended, no calcified stone. No biliary dilatation. Pancreas: No ductal dilatation or inflammation. Spleen: Normal in size without focal abnormality. Adrenals/Urinary Tract: Normal adrenal glands. No hydronephrosis or perinephric edema. Homogeneous renal enhancement with symmetric excretion on delayed phase imaging. Extrarenal pelvis configuration of the right kidney. Urinary bladder is physiologically distended without wall thickening. Stomach/Bowel: Persistent colonic wall thickening and pericolonic edema in the proximal sigmoid colon at site of prior diverticulitis. The inflamed diverticulum has decreased in size, and now contains a small focus of air. No abscess. Few additional noninflamed diverticulum in the distal colon. No new sites of active inflammation. Moderate stool burden in the more proximal colon. Normal appendix. No small bowel inflammation, wall thickening, or obstruction. No evidence of ileus. Vascular/Lymphatic: Abdominal aorta is normal in caliber. Mesenteric and portal veins are patent without thrombus. The IVC is unremarkable. No adenopathy. Reproductive: Post hysterectomy. Physiologic 2 cm cyst in the right ovary. Left ovary tentatively identified and normal.  No adnexal mass. Other: No abscess, free air, or free fluid. Tiny fat containing umbilical hernia. Musculoskeletal: There are no acute or suspicious osseous abnormalities. IMPRESSION: Persistent diverticulitis of the proximal sigmoid colon. Overall improvement in inflammatory change from prior exam with residual colonic wall thickening and pericolonic/peri diverticular edema. No abscess or perforation. No new abnormalities. Electronically Signed   By: Narda RutherfordMelanie  Sanford M.D.   On: 09/06/2018 20:58  EKG: No orders found for this or any previous visit.  IMPRESSION AND PLAN: *Acute sigmoid diverticulitis, failed outpatient management *Acute sinus tachycardia *Acute hypertension-exacerbated by pain *Chronic pain syndrome  Admit to regular nursing floor bed, empiric Zosyn given allergy to metronidazole, failed outpatient Augmentin for 7 days noted, IV fluids for rehydration, start low-dose beta-blocker therapy, adult pain protocol, will need outpatient endoscopy status post resolution in 2 to 4 weeks, and continue close medical monitoring   All the records are reviewed and case discussed with ED provider. Management plans discussed with the patient, family and they are in agreement.  CODE STATUS:full    TOTAL TIME TAKING CARE OF THIS PATIENT: 40 minutes.    Evelena AsaMontell D Renardo Cheatum M.D on 09/06/2018   Between 7am to 6pm - Pager - [phone]  After 6pm go to [email or web address] - password [email or web address]  [facility]  Office  [phone]  CC: Primary care physician; [facility]   Note: This dictation was prepared with Dragon dictation along with smaller phrase technology. Any transcriptional errors that result from this process are unintentional.

## 2018-09-07 ENCOUNTER — Encounter: Payer: Self-pay | Admitting: *Deleted

## 2018-09-07 DIAGNOSIS — K5732 Diverticulitis of large intestine without perforation or abscess without bleeding: Principal | ICD-10-CM

## 2018-09-07 LAB — CBC
HCT: 35.3 % — ABNORMAL LOW (ref 36.0–46.0)
Hemoglobin: 11.2 g/dL — ABNORMAL LOW (ref 12.0–15.0)
MCH: 29.6 pg (ref 26.0–34.0)
MCHC: 31.7 g/dL (ref 30.0–36.0)
MCV: 93.1 fL (ref 80.0–100.0)
Platelets: 458 10*3/uL — ABNORMAL HIGH (ref 150–400)
RBC: 3.79 MIL/uL — ABNORMAL LOW (ref 3.87–5.11)
RDW: 13.2 % (ref 11.5–15.5)
WBC: 11.9 10*3/uL — ABNORMAL HIGH (ref 4.0–10.5)
nRBC: 0 % (ref 0.0–0.2)

## 2018-09-07 MED ORDER — METRONIDAZOLE IN NACL 5-0.79 MG/ML-% IV SOLN
500.0000 mg | Freq: Three times a day (TID) | INTRAVENOUS | Status: DC
  Administered 2018-09-07 – 2018-09-09 (×6): 500 mg via INTRAVENOUS
  Filled 2018-09-07 (×11): qty 100

## 2018-09-07 MED ORDER — RISAQUAD PO CAPS
1.0000 | ORAL_CAPSULE | Freq: Three times a day (TID) | ORAL | Status: DC
  Administered 2018-09-07 – 2018-09-09 (×6): 1 via ORAL
  Filled 2018-09-07 (×6): qty 1

## 2018-09-07 MED ORDER — CIPROFLOXACIN IN D5W 400 MG/200ML IV SOLN
400.0000 mg | Freq: Two times a day (BID) | INTRAVENOUS | Status: DC
  Administered 2018-09-07 – 2018-09-09 (×4): 400 mg via INTRAVENOUS
  Filled 2018-09-07 (×8): qty 200

## 2018-09-07 MED ORDER — BACID PO TABS
2.0000 | ORAL_TABLET | Freq: Three times a day (TID) | ORAL | Status: DC
  Filled 2018-09-07 (×5): qty 2

## 2018-09-07 NOTE — Consult Note (Signed)
Cephas Darby, MD [address]  [place]  [place], [place] [address]  Main: [phone]  Fax: [phone] Pager: [phone]   Consultation  Referring Provider:     No ref. provider found Primary Care Physician:  [facility] Primary Gastroenterologist: None        Reason for Consultation:     Acute left-sided diverticulitis  Date of Admission:  09/06/2018 Date of Consultation:  09/07/2018         HPI:   Cheryl Blackburn is a 43 y.o. female with no significant past medical history, admitted with acute sigmoid diverticulitis.  Patient was initially diagnosed with acute diverticulitis on 08/29/2018 after acute onset of left lower quadrant abdominal pain, was sent home on Augmentin.  Given that her pain persisted, she presented to ER yesterday.  She had repeat CT which revealed persistent diverticulitis of the proximal sigmoid colon with overall improvement in inflammation.  She was started on Zosyn and Cipro and Flagyl today.  General surgery is also consulted for further evaluation.  Patient was seen by general surgery, Dr. Rosana Hoes and did not recommend any surgical intervention at this point as there is no evidence of abscess or perforation.  Patient reports that this is her first episode.  She reports her pain is improving compared to yesterday.  She is currently on full liquid diet and tolerating it well.  She reports having nausea secondary to neck pain and back pain as well as from antibiotics.  She denies fever, chills, vomiting or rectal bleeding.  She denies having diarrhea.   NSAIDs: None  Antiplts/Anticoagulants/Anti thrombotics: None  GI Procedures: None She denies family history of GI malignancy, inflammatory bowel disease She does not smoke or drink alcohol  History reviewed. No pertinent past medical history.  Past Surgical History:  Procedure Laterality Date  . ABDOMINAL HYSTERECTOMY      Prior to Admission medications   Medication Sig  Start Date End Date Taking? Authorizing Provider  acetaminophen-codeine (TYLENOL #3) 300-30 MG tablet Take 1 tablet by mouth every 4 (four) hours as needed for moderate pain.   Yes [provider]  omeprazole (PRILOSEC) 20 MG capsule Take 20 mg by mouth daily.   Yes [provider]  ondansetron (ZOFRAN) 4 MG tablet Take 1 tablet by mouth daily as needed.    Yes [provider]  pregabalin (LYRICA) 50 MG capsule Take 50 mg by mouth at bedtime.   Yes [provider]  promethazine (PHENERGAN) 12.5 MG tablet Take 1 tablet by mouth at bedtime as needed.    Yes [provider]  acetaminophen-codeine (TYLENOL #2) 300-15 MG tablet Take 1 tablet by mouth every 4 (four) hours as needed for moderate pain. Patient not taking: Reported on 09/06/2018 08/29/18 08/29/19  Lavonia Drafts, MD  traMADol (ULTRAM) 50 MG tablet Take 1 tablet (50 mg total) by mouth every 6 (six) hours as needed. Patient not taking: Reported on 09/06/2018 08/29/18 08/29/19  Lavonia Drafts, MD    Current Facility-Administered Medications:  .  0.45 % NaCl with KCl 20 mEq / L infusion, , Intravenous, Continuous, Salary, Avel Peace, MD, Stopped at 09/07/18 1549 .  acetaminophen (TYLENOL) tablet 650 mg, 650 mg, Oral, Q6H PRN **OR** acetaminophen (TYLENOL) suppository 650 mg, 650 mg, Rectal, Q6H PRN, Salary, Montell D, MD .  acidophilus (RISAQUAD) capsule 1 capsule, 1 capsule, Oral, TID, Mody, Sital, MD, 1 capsule at 09/07/18 1548 .  ciprofloxacin (CIPRO) IVPB 400 mg, 400 mg, Intravenous,  Q12H, Bettey Costa, MD, Last Rate: 200 mL/hr at 09/07/18 1550 .  enoxaparin (LOVENOX) injection 40 mg, 40 mg, Subcutaneous, Q24H, Salary, Montell D, MD, 40 mg at 09/06/18 2345 .  hydrALAZINE (APRESOLINE) injection 10 mg, 10 mg, Intravenous, Q4H PRN, Salary, Montell D, MD .  metoprolol tartrate (LOPRESSOR) tablet 25 mg, 25 mg, Oral, BID, Salary, Montell D, MD, 25 mg at 09/07/18 0906 .  metroNIDAZOLE (FLAGYL) IVPB 500 mg,  500 mg, Intravenous, Q8H, Bettey Costa, MD, Stopped at 09/07/18 1107 .  morphine 2 MG/ML injection 2 mg, 2 mg, Intravenous, Q2H PRN, Salary, Montell D, MD .  ondansetron (ZOFRAN) tablet 4 mg, 4 mg, Oral, Q6H PRN **OR** ondansetron (ZOFRAN) injection 4 mg, 4 mg, Intravenous, Q6H PRN, Salary, Montell D, MD, 4 mg at 09/07/18 1120 .  traMADol (ULTRAM) tablet 50 mg, 50 mg, Oral, Q6H PRN, Salary, Montell D, MD  Family History  Problem Relation Age of Onset  . Breast cancer Maternal Aunt        4 mat aunts     Social History   Tobacco Use  . Smoking status: Former Research scientist (life sciences)  . Smokeless tobacco: Never Used  Substance Use Topics  . Alcohol use: Not on file  . Drug use: Not on file    Allergies as of 09/06/2018 - Review Complete 09/06/2018  Allergen Reaction Noted  . Clindamycin    . Latex    . Metronidazole      Review of Systems:    All systems reviewed and negative except where noted in HPI.   Physical Exam:  Vital signs in last 24 hours: Temp:  [97.6 F (36.4 C)-98 F (36.7 C)] 98 F (36.7 C) (01/06 1100) Pulse Rate:  [71-88] 77 (01/06 1100) Resp:  [16-18] 18 (01/06 1100) BP: (100-138)/(66-80) 119/72 (01/06 1100) SpO2:  [98 %-100 %] 98 % (01/06 1100) Weight:  [55.9 kg] 55.9 kg (01/06 0505) Last BM Date: 09/06/18 General:   Pleasant, cooperative in NAD Head:  Normocephalic and atraumatic. Eyes:   No icterus.   Conjunctiva pink. PERRLA. Ears:  Normal auditory acuity. Neck:  Supple; no masses or thyroidomegaly Lungs: Respirations even and unlabored. Lungs clear to auscultation bilaterally.   No wheezes, crackles, or rhonchi.  Heart:  Regular rate and rhythm;  Without murmur, clicks, rubs or gallops Abdomen:  Soft, nondistended, mild to moderate left lower quadrant tenderness. Normal bowel sounds. No appreciable masses or hepatomegaly.  No rebound or guarding.  Rectal:  Not performed. Msk:  Symmetrical without gross deformities.  Strength normal Extremities:  Without edema,  cyanosis or clubbing. Neurologic:  Alert and oriented x3;  grossly normal neurologically. Skin:  Intact without significant lesions or rashes. Psych:  Alert and cooperative. Normal affect.  LAB RESULTS: CBC Latest Ref Rng & Units 09/07/2018 09/06/2018 08/29/2018  WBC 4.0 - 10.5 K/uL 11.9(H) 12.1(H) 18.7(H)  Hemoglobin 12.0 - 15.0 g/dL 11.2(L) 12.7 13.2  Hematocrit 36.0 - 46.0 % 35.3(L) 39.1 40.9  Platelets 150 - 400 K/uL 458(H) 562(H) 568(H)    BMET BMP Latest Ref Rng & Units 09/06/2018 08/29/2018  Glucose 70 - 99 mg/dL 97 114(H)  BUN 6 - 20 mg/dL 9 13  Creatinine 0.44 - 1.00 mg/dL 0.72 0.78  Sodium 135 - 145 mmol/L 135 135  Potassium 3.5 - 5.1 mmol/L 3.5 4.2  Chloride 98 - 111 mmol/L 101 101  CO2 22 - 32 mmol/L 26 27  Calcium 8.9 - 10.3 mg/dL 9.8 9.6    LFT Hepatic Function Latest Ref Rng &  Units 09/06/2018  Total Protein 6.5 - 8.1 g/dL 7.2  Albumin 3.5 - 5.0 g/dL 4.2  AST 15 - 41 U/L 17  ALT 0 - 44 U/L 13  Alk Phosphatase 38 - 126 U/L 42  Total Bilirubin 0.3 - 1.2 mg/dL 0.6     STUDIES: Ct Abdomen Pelvis W Contrast  Result Date: 09/06/2018 CLINICAL DATA:  Ongoing left lower quadrant pain and bloody diarrhea. Diagnosed with diverticulitis 1 week ago, persistent pain after completing antibiotics. EXAM: CT ABDOMEN AND PELVIS WITH CONTRAST TECHNIQUE: Multidetector CT imaging of the abdomen and pelvis was performed using the standard protocol following bolus administration of intravenous contrast. CONTRAST:  25m OMNIPAQUE IOHEXOL 300 MG/ML  SOLN COMPARISON:  CT 10/30/2017 FINDINGS: Lower chest: Mild hypoventilatory atelectasis in the right lower lobe. No pleural fluid. Hepatobiliary: Tiny subcapsular hypodensity in the right lobe of the liver, too small to characterize. Gallbladder physiologically distended, no calcified stone. No biliary dilatation. Pancreas: No ductal dilatation or inflammation. Spleen: Normal in size without focal abnormality. Adrenals/Urinary Tract: Normal adrenal  glands. No hydronephrosis or perinephric edema. Homogeneous renal enhancement with symmetric excretion on delayed phase imaging. Extrarenal pelvis configuration of the right kidney. Urinary bladder is physiologically distended without wall thickening. Stomach/Bowel: Persistent colonic wall thickening and pericolonic edema in the proximal sigmoid colon at site of prior diverticulitis. The inflamed diverticulum has decreased in size, and now contains a small focus of air. No abscess. Few additional noninflamed diverticulum in the distal colon. No new sites of active inflammation. Moderate stool burden in the more proximal colon. Normal appendix. No small bowel inflammation, wall thickening, or obstruction. No evidence of ileus. Vascular/Lymphatic: Abdominal aorta is normal in caliber. Mesenteric and portal veins are patent without thrombus. The IVC is unremarkable. No adenopathy. Reproductive: Post hysterectomy. Physiologic 2 cm cyst in the right ovary. Left ovary tentatively identified and normal. No adnexal mass. Other: No abscess, free air, or free fluid. Tiny fat containing umbilical hernia. Musculoskeletal: There are no acute or suspicious osseous abnormalities. IMPRESSION: Persistent diverticulitis of the proximal sigmoid colon. Overall improvement in inflammatory change from prior exam with residual colonic wall thickening and pericolonic/peri diverticular edema. No abscess or perforation. No new abnormalities. Electronically Signed   By: MKeith RakeM.D.   On: 09/06/2018 20:58      Impression / Plan:   Cheryl Lepkowskiis a 43y.o. Caucasian female with no significant past medical history, admitted with acute uncomplicated sigmoid diverticulitis.  Patient is currently on IV antibiotics with combination of Cipro and metronidazole.  Slowly improving clinically.  No indication for inpatient endoscopic evaluation Continue Cipro and Flagyl twice daily for 2 weeks total Follow-up in GI clinic after  discharge, will discuss about the timing of colonoscopy at that time  Thank you for involving me in the care of this patient.  GI will sign off.  Please call [email or web address] with questions or concerns    LOS: 1 day   RSherri Sear MD  09/07/2018, 6:23 PM   Note: This dictation was prepared with Dragon dictation along with smaller phrase technology. Any transcriptional errors that result from this process are unintentional.

## 2018-09-07 NOTE — Consult Note (Addendum)
SURGICAL CONSULTATION NOTE (initial) - cpt: [phone]  Patient seen and examined as described below with surgical PA-C, Gillermina Phy.  Assessment/Plan: (ICD-10's: K33.32) 43 y.o. female with persistent sigmoid colonic diverticulitis, complicated by chronic constipation and comorbidities including GERD and former tobacco abuse (smoking) s/p abdominal hysterectomy.               - pain control prn, minimize narcotics              - though typically advance diet over a few days, okay for soft diet if tolerating             - IV antibiotics as per medical team, though consider ceftriaxone and metronidazole if not improving             - when tolerating PO, will need to maintain hydration + initially low fiber x 6 weeks, then high fiber diet with once daily Colace stool softener to better reduce/manage chronic constipation             - possibility of surgery with partial colectomy and likely colostomy also discussed if doesn't improve/resolve with antibiotics  - will continue to follow while inpatient, after which outpatient surgical follow-up in 1 week advised             - continue to monitor abdominal exam and bowel function             - DVT prophylaxis, ambulation encouraged  I have personally reviewed the patient's chart, evaluated/examined the patient, proposed the recommended management, and discussed these recommendations with the patientto her expressed satisfaction as well as with patient's RN.  Thank you for the opportunity to participate in this patient's care.  -- Scherrie Gerlach Earlene Plater, MD, RPVI [place]: [facility] General Surgery - Partnering for exceptional care. Office: [phone]  [facility] SURGICAL CONSULTATION NOTE (initial) - cpt: [phone]   HISTORY OF PRESENT ILLNESS (HPI):  43 y.o. female who presented to [facility] ED on 08/29/18 for the acute onset of LLQ abdominal pain and was diagnosed with diverticulitis and discharged with prescription  for Augmentin x 7 days, which she completed. She then presented to [facility] ED again yesterday (1/5) because of persistent only slightly improved LLQ abdominal pain and her primary concern being the 1-2 loose BM's per day. She does also endorsed decreased appetite and nausea without emesis, fever, or chills. Work up in the ED on 1/5 was concerning for persistent but improving sigmoid diverticulitis without perforation or abscess. Patient was admitted to hospitalist service with IV antibiotic and clear liquids diet yesterday, advanced to soft diet this morning.  Patient today reports her pain has improved some, but she feels very tired. She denies any fevers, chills, CP, SOB, nausea, or emesis. She continues to endorse flatus, though her last BM was yesterday. She also endorses intermittent constipation at home without any history of similar pain in the past and denies any prior episode of diverticulitis. Her abdominal surgery history is significant for abdominal hysterectomy. No other complaints today.   Surgery is consulted by hospitalist physician Dr. Juliene Pina, MD in this context for evaluation and management of diverticulitis.  PAST MEDICAL HISTORY (PMH):  History reviewed. No pertinent past medical history.   PAST SURGICAL HISTORY (PSH):  Past Surgical History:  Procedure Laterality Date  . ABDOMINAL HYSTERECTOMY      MEDICATIONS:  Prior to Admission medications   Medication Sig Start Date End Date Taking? Authorizing Provider  acetaminophen-codeine (TYLENOL #3) 300-30 MG  tablet Take 1 tablet by mouth every 4 (four) hours as needed for moderate pain.   Yes [provider]  omeprazole (PRILOSEC) 20 MG capsule Take 20 mg by mouth daily.   Yes [provider]  ondansetron (ZOFRAN) 4 MG tablet Take 1 tablet by mouth daily as needed.    Yes [provider]  pregabalin (LYRICA) 50 MG capsule Take 50 mg by mouth at bedtime.   Yes [provider]  promethazine (PHENERGAN)  12.5 MG tablet Take 1 tablet by mouth at bedtime as needed.    Yes [provider]  acetaminophen-codeine (TYLENOL #2) 300-15 MG tablet Take 1 tablet by mouth every 4 (four) hours as needed for moderate pain. Patient not taking: Reported on 09/06/2018 08/29/18 08/29/19  Jene EveryKinner, Robert, MD  traMADol (ULTRAM) 50 MG tablet Take 1 tablet (50 mg total) by mouth every 6 (six) hours as needed. Patient not taking: Reported on 09/06/2018 08/29/18 08/29/19  Jene EveryKinner, Robert, MD    ALLERGIES:  Allergies  Allergen Reactions  . Clindamycin   . Latex   . Metronidazole     SOCIAL HISTORY:  Social History   Socioeconomic History  . Marital status: Single    Spouse name: Not on file  . Number of children: Not on file  . Years of education: Not on file  . Highest education level: Not on file  Occupational History  . Not on file  Social Needs  . Financial resource strain: Not on file  . Food insecurity:    Worry: Not on file    Inability: Not on file  . Transportation needs:    Medical: Not on file    Non-medical: Not on file  Tobacco Use  . Smoking status: Former Games developermoker  . Smokeless tobacco: Never Used  Substance and Sexual Activity  . Alcohol use: Not on file  . Drug use: Not on file  . Sexual activity: Not on file  Lifestyle  . Physical activity:    Days per week: Not on file    Minutes per session: Not on file  . Stress: Not on file  Relationships  . Social connections:    Talks on phone: Not on file    Gets together: Not on file    Attends religious service: Not on file    Active member of club or organization: Not on file    Attends meetings of clubs or organizations: Not on file    Relationship status: Not on file  . Intimate partner violence:    Fear of current or ex partner: Not on file    Emotionally abused: Not on file    Physically abused: Not on file    Forced sexual activity: Not on file  Other Topics Concern  . Not on file  Social History Narrative  . Not on  file    FAMILY HISTORY:  Family History  Problem Relation Age of Onset  . Breast cancer Maternal Aunt        4 mat aunts    REVIEW OF SYSTEMS:  Review of Systems  Constitutional: Negative for chills, fever and malaise/fatigue.  HENT: Negative for congestion.   Respiratory: Negative for cough.   Cardiovascular: Negative for chest pain and palpitations.  Gastrointestinal: Positive for abdominal pain and nausea. Negative for blood in stool, constipation, diarrhea and vomiting.  Genitourinary: Positive for urgency. Negative for dysuria and hematuria.  Musculoskeletal: Negative for joint pain and myalgias.  Neurological: Negative for dizziness and headaches.  All  other systems reviewed and are negative.  VITAL SIGNS:  Temp:  [97.6 F (36.4 C)-98.4 F (36.9 C)] 98 F (36.7 C) (01/06 0505) Pulse Rate:  [71-119] 71 (01/06 0505) Resp:  [16-18] 16 (01/06 0505) BP: (100-153)/(66-88) 100/66 (01/06 0505) SpO2:  [99 %-100 %] 99 % (01/06 0505) Weight:  [54.9 kg-55.9 kg] 55.9 kg (01/06 0505)     Height: 5\' 3"  (160 cm) Weight: 55.9 kg BMI (Calculated): 21.84   INTAKE/OUTPUT:  This shift: Total I/O In: -  Out: 400 [Urine:400]  Last 2 shifts: @IOLAST2SHIFTS @   PHYSICAL EXAM:  Physical Exam Constitutional:      General: She is not in acute distress.    Appearance: She is well-developed and normal weight. She is not ill-appearing or toxic-appearing.  HENT:     Head: Normocephalic and atraumatic.  Eyes:     General: No scleral icterus.    Extraocular Movements: Extraocular movements intact.  Cardiovascular:     Rate and Rhythm: Normal rate and regular rhythm.     Heart sounds: Normal heart sounds. No murmur. No friction rub. No gallop.   Pulmonary:     Effort: Pulmonary effort is normal. No respiratory distress.     Breath sounds: Normal breath sounds. No wheezing or rhonchi.  Abdominal:     General: Abdomen is flat. There is no distension.     Palpations: Abdomen is soft. There is  no shifting dullness or hepatomegaly.     Tenderness: There is abdominal tenderness in the left lower quadrant. There is no guarding or rebound.     Comments: Laparoscopic incision, well healed, inferior to umbilicus  Genitourinary:    Comments: Deferred Skin:    General: Skin is warm and dry.     Coloration: Skin is not jaundiced or pale.  Neurological:     General: No focal deficit present.     Mental Status: She is alert and oriented to person, place, and time.  Psychiatric:        Mood and Affect: Mood normal.        Behavior: Behavior normal.    Labs:  CBC Latest Ref Rng & Units 09/07/2018 09/06/2018 08/29/2018  WBC 4.0 - 10.5 K/uL 11.9(H) 12.1(H) 18.7(H)  Hemoglobin 12.0 - 15.0 g/dL 11.2(L) 12.7 13.2  Hematocrit 36.0 - 46.0 % 35.3(L) 39.1 40.9  Platelets 150 - 400 K/uL 458(H) 562(H) 568(H)   CMP Latest Ref Rng & Units 09/06/2018 08/29/2018  Glucose 70 - 99 mg/dL 97 811(B114(H)  BUN 6 - 20 mg/dL 9 13  Creatinine 1.470.44 - 1.00 mg/dL 8.290.72 5.620.78  Sodium 130135 - 145 mmol/L 135 135  Potassium 3.5 - 5.1 mmol/L 3.5 4.2  Chloride 98 - 111 mmol/L 101 101  CO2 22 - 32 mmol/L 26 27  Calcium 8.9 - 10.3 mg/dL 9.8 9.6  Total Protein 6.5 - 8.1 g/dL 7.2 -  Total Bilirubin 0.3 - 1.2 mg/dL 0.6 -  Alkaline Phos 38 - 126 U/L 42 -  AST 15 - 41 U/L 17 -  ALT 0 - 44 U/L 13 -     Imaging studies:  CT Abdomen/Pelvis (09/06/2018):  Persistent diverticulitis of the proximal sigmoid colon. Overall improvement in inflammatory change from prior exam with residual colonic wall thickening and pericolonic/peri diverticular edema. No abscess or perforation.  Assessment/Plan: (ICD-10's: 76K57.32) 43 y.o. female with persistent but improving acute sigmoid diverticulitis following outpatient antibiotics x 7 days, complicated by pertinent comorbidities including former tobacco abuse (smoking).   - Continue IV ABx (Cipro,  Flagyl) for 48 hours  - Okay for soft diet if tolerating and pain improving, wean IVF   - Pain  control as needed (minimize narcotics), antiemetics as needed  - Continue to monitor abdominal examination and on-going bowel function  - No indication for emergent surgical intervention currently. She understands that if she were to clinically deteriorate she may require intervention.   - Outpatient management of intermittent constipation  - Medical management per primary team  - Mobilize  All of the above findings and recommendations were discussed with the patient, and all of patient's questions were answered to her expressed satisfaction.  Thank you for the opportunity to participate in this patient's care.   -- Lynden Oxford, PA-C [facility] 09/07/2018, 10:48 AM [phone] M-F: 7am - 4pm

## 2018-09-07 NOTE — Progress Notes (Signed)
[facility] -  at [facility]   PATIENT NAME: Cheryl [facility]    MR#:  [id number]  DATE OF BIRTH:  04/12/1976  SUBJECTIVE:   Patient presented with rectal bleeding and worsening of left lower quadrant abdominal pain  REVIEW OF SYSTEMS:    Review of Systems  Constitutional: Negative for fever, chills weight loss HENT: Negative for ear pain, nosebleeds, congestion, facial swelling, rhinorrhea, neck pain, neck stiffness and ear discharge.   Respiratory: Negative for cough, shortness of breath, wheezing  Cardiovascular: Negative for chest pain, palpitations and leg swelling.  Gastrointestinal: Negative for heartburn, ++improved somewhat abdominal pain, no vomiting, diarrhea or consitpation Genitourinary: Negative for dysuria, urgency, frequency, hematuria Musculoskeletal: Negative for back pain or joint pain Neurological: Negative for dizziness, seizures, syncope, focal weakness,  numbness and headaches.  Hematological: Does not bruise/bleed easily.  Psychiatric/Behavioral: Negative for hallucinations, confusion, dysphoric mood    Tolerating Diet: yes      DRUG ALLERGIES:   Allergies  Allergen Reactions  . Clindamycin   . Latex   . Metronidazole     VITALS:  Blood pressure 100/66, pulse 71, temperature 98 F (36.7 C), temperature source Oral, resp. rate 16, height 5\' 3"  (1.6 m), weight 55.9 kg, SpO2 99 %.  PHYSICAL EXAMINATION:  Constitutional: Appears well-developed and well-nourished. No distress. HENT: Normocephalic. Marland Kitchen. Oropharynx is clear and moist.  Eyes: Conjunctivae and EOM are normal. PERRLA, no scleral icterus.  Neck: Normal ROM. Neck supple. No JVD. No tracheal deviation. CVS: RRR, S1/S2 +, no murmurs, no gallops, no carotid bruit.  Pulmonary: Effort and breath sounds normal, no stridor, rhonchi, wheezes, rales.  Abdominal: Soft. BS +,  no distension, tenderness, rebound or guarding.  Musculoskeletal: Normal range of motion. No edema and  no tenderness.  Neuro: Alert. CN 2-12 grossly intact. No focal deficits. Skin: Skin is warm and dry. No rash noted. Psychiatric: Normal mood and affect.      LABORATORY PANEL:   CBC Recent Labs  Lab 09/07/18 0530  WBC 11.9*  HGB 11.2*  HCT 35.3*  PLT 458*   ------------------------------------------------------------------------------------------------------------------  Chemistries  Recent Labs  Lab 09/06/18 1508  NA 135  K 3.5  CL 101  CO2 26  GLUCOSE 97  BUN 9  CREATININE 0.72  CALCIUM 9.8  AST 17  ALT 13  ALKPHOS 42  BILITOT 0.6   ------------------------------------------------------------------------------------------------------------------  Cardiac Enzymes No results for input(s): TROPONINI in the last 168 hours. ------------------------------------------------------------------------------------------------------------------  RADIOLOGY:  Ct Abdomen Pelvis W Contrast  Result Date: 09/06/2018 CLINICAL DATA:  Ongoing left lower quadrant pain and bloody diarrhea. Diagnosed with diverticulitis 1 week ago, persistent pain after completing antibiotics. EXAM: CT ABDOMEN AND PELVIS WITH CONTRAST TECHNIQUE: Multidetector CT imaging of the abdomen and pelvis was performed using the standard protocol following bolus administration of intravenous contrast. CONTRAST:  75mL OMNIPAQUE IOHEXOL 300 MG/ML  SOLN COMPARISON:  CT 10/30/2017 FINDINGS: Lower chest: Mild hypoventilatory atelectasis in the right lower lobe. No pleural fluid. Hepatobiliary: Tiny subcapsular hypodensity in the right lobe of the liver, too small to characterize. Gallbladder physiologically distended, no calcified stone. No biliary dilatation. Pancreas: No ductal dilatation or inflammation. Spleen: Normal in size without focal abnormality. Adrenals/Urinary Tract: Normal adrenal glands. No hydronephrosis or perinephric edema. Homogeneous renal enhancement with symmetric excretion on delayed phase imaging.  Extrarenal pelvis configuration of the right kidney. Urinary bladder is physiologically distended without wall thickening. Stomach/Bowel: Persistent colonic wall thickening and pericolonic edema in the proximal sigmoid colon at site of prior  diverticulitis. The inflamed diverticulum has decreased in size, and now contains a small focus of air. No abscess. Few additional noninflamed diverticulum in the distal colon. No new sites of active inflammation. Moderate stool burden in the more proximal colon. Normal appendix. No small bowel inflammation, wall thickening, or obstruction. No evidence of ileus. Vascular/Lymphatic: Abdominal aorta is normal in caliber. Mesenteric and portal veins are patent without thrombus. The IVC is unremarkable. No adenopathy. Reproductive: Post hysterectomy. Physiologic 2 cm cyst in the right ovary. Left ovary tentatively identified and normal. No adnexal mass. Other: No abscess, free air, or free fluid. Tiny fat containing umbilical hernia. Musculoskeletal: There are no acute or suspicious osseous abnormalities. IMPRESSION: Persistent diverticulitis of the proximal sigmoid colon. Overall improvement in inflammatory change from prior exam with residual colonic wall thickening and pericolonic/peri diverticular edema. No abscess or perforation. No new abnormalities. Electronically Signed   By: Narda RutherfordMelanie  Sanford M.D.   On: 09/06/2018 20:58     ASSESSMENT AND PLAN:   43 year old female with no past medical history who presents with failed outpatient treatment for acute diverticulitis.  1.  Acute sigmoid diverticulitis without evidence of abscess or perforation, failed outpatient therapy with Augmentin: Currently on Zosyn We will try Flagyl and ciprofloxacin.  Patient reports she does not have allergies to either these medications she just has high sensitivity to antibiotics. If she tolerates these medications then we can discontinue Zosyn Await GI and surgery evaluation Surgery  consultation placed via epic to Dr. Jacquelin Hawkingavis Soft diet Add probiotic  2.  Rectal bleeding which is resolved: Await GI consultation        Management plans discussed with the patient and she is in agreement.  CODE STATUS: Full  TOTAL TIME TAKING CARE OF THIS PATIENT: 30 minutes.     POSSIBLE D/C tomorrow, DEPENDING ON CLINICAL CONDITION.   Cheryl Blackburn M.D on 09/07/2018 at 11:45 AM  Between 7am to 6pm - Pager - [phone] After 6pm go to [email or web address] - password [email or web address]  [facility]  Office  [phone]  CC: Primary care physician; Practice, [facility]  Note: This dictation was prepared with Dragon dictation along with smaller phrase technology. Any transcriptional errors that result from this process are unintentional.

## 2018-09-08 LAB — GASTROINTESTINAL PANEL BY PCR, STOOL (REPLACES STOOL CULTURE)
ADENOVIRUS F40/41: NOT DETECTED
ASTROVIRUS: NOT DETECTED
CAMPYLOBACTER SPECIES: NOT DETECTED
CYCLOSPORA CAYETANENSIS: NOT DETECTED
Cryptosporidium: NOT DETECTED
ENTEROPATHOGENIC E COLI (EPEC): NOT DETECTED
ENTEROTOXIGENIC E COLI (ETEC): NOT DETECTED
Entamoeba histolytica: NOT DETECTED
Enteroaggregative E coli (EAEC): NOT DETECTED
Giardia lamblia: NOT DETECTED
NOROVIRUS GI/GII: NOT DETECTED
PLESIMONAS SHIGELLOIDES: NOT DETECTED
Rotavirus A: NOT DETECTED
Salmonella species: NOT DETECTED
Sapovirus (I, II, IV, and V): NOT DETECTED
Shiga like toxin producing E coli (STEC): NOT DETECTED
Shigella/Enteroinvasive E coli (EIEC): NOT DETECTED
VIBRIO SPECIES: NOT DETECTED
Vibrio cholerae: NOT DETECTED
Yersinia enterocolitica: NOT DETECTED

## 2018-09-08 LAB — CBC
HCT: 36.3 % (ref 36.0–46.0)
HEMOGLOBIN: 11.3 g/dL — AB (ref 12.0–15.0)
MCH: 29.4 pg (ref 26.0–34.0)
MCHC: 31.1 g/dL (ref 30.0–36.0)
MCV: 94.3 fL (ref 80.0–100.0)
Platelets: 431 10*3/uL — ABNORMAL HIGH (ref 150–400)
RBC: 3.85 MIL/uL — ABNORMAL LOW (ref 3.87–5.11)
RDW: 13.4 % (ref 11.5–15.5)
WBC: 12.4 10*3/uL — ABNORMAL HIGH (ref 4.0–10.5)
nRBC: 0 % (ref 0.0–0.2)

## 2018-09-08 LAB — C DIFFICILE QUICK SCREEN W PCR REFLEX
C DIFFICILE (CDIFF) INTERP: NOT DETECTED
C DIFFICLE (CDIFF) ANTIGEN: NEGATIVE
C Diff toxin: NEGATIVE

## 2018-09-08 LAB — HIV ANTIBODY (ROUTINE TESTING W REFLEX): HIV Screen 4th Generation wRfx: NONREACTIVE

## 2018-09-08 NOTE — Progress Notes (Addendum)
SURGICAL PROGRESS NOTE (cpt: [phone])  Patient seen and examined as described below with surgical PA-C, Gillermina PhyZachary Shulz.  Assessment/Plan: (ICD-10's: 79K57.32) 43 y.o. female with persistent sigmoid colonic diverticulitis, complicated by chronic constipation and comorbidities including GERD and former tobacco abuse (smoking) s/p abdominal hysterectomy.    - IV antibiotics as ordered - pain control prn, minimize narcotics  - continue gradual advancement with soft diet for now - maintain hydration + initially low fiber x 6 weeks, then high fiber diet with once daily Colace stool softener to better reduce/manage chronic constipation             - discharge planning with outpatient surgical follow-up in 1 week - continue to monitor abdominal exam and bowel function - DVT prophylaxis, ambulation encouraged  I have personally reviewed the patient's chart, evaluated/examined the patient, proposed the recommended management, and discussed these recommendations with the patientto her expressed satisfaction as well as with patient's RN.  -- Scherrie GerlachJason E. Earlene Plateravis, MD, RPVI [place]: [facility] General Surgery - Partnering for exceptional care. Office: [phone]  [facility] SURGICAL PROGRESS NOTE (cpt [phone])  Hospital Day(s): 2.   Post op day(s):  Marland Kitchen.   Interval History: Patient seen and examined, no acute events or new complaints overnight. Patient reports that she continues to notice improvement but has mild soreness worse in her LLQ. She denies fevers, chills, CP, SOB, nausea, or emesis. She continues to endorse passing flatus. She is tolerating a soft diet. Mobilizing well.  Review of Systems:  Constitutional: denies fever, chills  Respiratory: denies any shortness of breath  Cardiovascular: denies chest pain or palpitations  Gastrointestinal: + abdominal pain (Improved), denied N/V, or  diarrhea/and bowel function as per interval history Genitourinary: denies burning with urination or urinary frequency  Vital signs in last 24 hours: [min-max] current  Temp:  [98 F (36.7 C)-98.2 F (36.8 C)] 98.2 F (36.8 C) (01/07 0446) Pulse Rate:  [65-78] 65 (01/07 0446) Resp:  [16-18] 16 (01/07 0446) BP: (110-119)/(72-91) 110/91 (01/07 0446) SpO2:  [98 %-100 %] 100 % (01/07 0446)     Height: 5\' 3"  (160 cm) Weight: 55.9 kg BMI (Calculated): 21.84   Intake/Output this shift:  No intake/output data recorded.   Intake/Output last 2 shifts:  @IOLAST2SHIFTS @   Physical Exam:  Constitutional: alert, cooperative and no distress  HENT: normocephalic without obvious abnormality  Eyes: EOM's grossly intact and symmetric  Respiratory: breathing non-labored at rest  Gastrointestinal: soft, non-tender, and non-distended Musculoskeletal: no edema or wounds, motor and sensation grossly intact, NT   Labs:  CBC Latest Ref Rng & Units 09/08/2018 09/07/2018 09/06/2018  WBC 4.0 - 10.5 K/uL 12.4(H) 11.9(H) 12.1(H)  Hemoglobin 12.0 - 15.0 g/dL 11.3(L) 11.2(L) 12.7  Hematocrit 36.0 - 46.0 % 36.3 35.3(L) 39.1  Platelets 150 - 400 K/uL 431(H) 458(H) 562(H)   CMP Latest Ref Rng & Units 09/06/2018 08/29/2018  Glucose 70 - 99 mg/dL 97 295(A114(H)  BUN 6 - 20 mg/dL 9 13  Creatinine 2.130.44 - 1.00 mg/dL 0.860.72 5.780.78  Sodium 469135 - 145 mmol/L 135 135  Potassium 3.5 - 5.1 mmol/L 3.5 4.2  Chloride 98 - 111 mmol/L 101 101  CO2 22 - 32 mmol/L 26 27  Calcium 8.9 - 10.3 mg/dL 9.8 9.6  Total Protein 6.5 - 8.1 g/dL 7.2 -  Total Bilirubin 0.3 - 1.2 mg/dL 0.6 -  Alkaline Phos 38 - 126 U/L 42 -  AST 15 - 41 U/L 17 -  ALT 0 - 44  U/L 13 -   Assessment/Plan: (ICD-10's: K71.32) 43 y.o. female with persistent but improving acute sigmoid diverticulitis following outpatient antibiotics x 7 days, complicated by pertinent comorbidities including former tobacco abuse (smoking).   - Continue IV ABx (Cipro, Flagyl) today, transition  to PO if here tomorrow or prior to discharge             - Continue soft diet, discontinue IVF             - Pain control as needed (minimize narcotics), antiemetics as needed             - Continue to monitor abdominal examination and on-going bowel function             - No indication for emergent surgical intervention currently. She understands that if she were to clinically deteriorate she may require intervention.              - Outpatient management of intermittent constipation and surgical follow up in 1 week  - Appreciate GI evaluation recommendation provided, appreciate their input             - Mobilize  - DVT prophylaxis  All of the above findings and recommendations were discussed with the patient, and the medical team, and all of patient's questions were answered to her expressed satisfaction.  -- Lynden Oxford, PA-C [facility] 09/08/2018, 9:38 AM [phone] M-F: 7am - 4pm

## 2018-09-08 NOTE — Progress Notes (Signed)
[name], MD [address]  [address]  [place], [place] [address]  Main: [phone]  Fax: [phone] Pager: [phone]   Subjective: Reports having severe headache associated with nausea today and unable to eat.  However, her lower abdominal pain has significantly improved.  Objective: Vital signs in last 24 hours: Vitals:   09/07/18 1100 09/07/18 2052 09/08/18 0446 09/08/18 1133  BP: 119/72 110/73 (!) 110/91 (!) 94/59  Pulse: 77 78 65 64  Resp: 18 16 16 16   Temp: 98 F (36.7 C) 98 F (36.7 C) 98.2 F (36.8 C) 98.2 F (36.8 C)  TempSrc: Oral Oral Oral Oral  SpO2: 98% 100% 100% 99%  Weight:      Height:       Weight change:   Intake/Output Summary (Last 24 hours) at 09/08/2018 1702 Last data filed at 09/08/2018 1034 Gross per 24 hour  Intake 1294.15 ml  Output 1200 ml  Net 94.15 ml     Exam: Heart:: Regular rate and rhythm or S1S2 present Lungs: normal and clear to auscultation Abdomen: soft, nontender, normal bowel sounds   Lab Results: @LABTEST2 @ Micro Results: Recent Results (from the past 240 hour(s))  Gastrointestinal Panel by PCR , Stool     Status: None   Collection Time: 09/06/18  6:44 PM  Result Value Ref Range Status   Campylobacter species NOT DETECTED NOT DETECTED Final   Plesimonas shigelloides NOT DETECTED NOT DETECTED Final   Salmonella species NOT DETECTED NOT DETECTED Final   Yersinia enterocolitica NOT DETECTED NOT DETECTED Final   Vibrio species NOT DETECTED NOT DETECTED Final   Vibrio cholerae NOT DETECTED NOT DETECTED Final   Enteroaggregative E coli (EAEC) NOT DETECTED NOT DETECTED Final   Enteropathogenic E coli (EPEC) NOT DETECTED NOT DETECTED Final   Enterotoxigenic E coli (ETEC) NOT DETECTED NOT DETECTED Final   Shiga like toxin producing E coli (STEC) NOT DETECTED NOT DETECTED Final   Shigella/Enteroinvasive E coli (EIEC) NOT DETECTED NOT DETECTED Final   Cryptosporidium NOT DETECTED NOT DETECTED Final   Cyclospora cayetanensis NOT DETECTED NOT DETECTED Final   Entamoeba histolytica NOT DETECTED NOT DETECTED Final   Giardia lamblia NOT DETECTED NOT DETECTED Final   Adenovirus F40/41 NOT DETECTED NOT DETECTED Final   Astrovirus NOT DETECTED NOT DETECTED Final   Norovirus GI/GII NOT DETECTED NOT DETECTED Final   Rotavirus A NOT DETECTED NOT DETECTED Final   Sapovirus (I, II, IV, and V) NOT DETECTED NOT DETECTED Final    Comment: Performed at [facility], [address] [place], [place] [address]  C difficile quick scan w PCR reflex     Status: None   Collection Time: 09/06/18  6:44 PM  Result Value Ref Range Status   C Diff antigen NEGATIVE NEGATIVE Final   C Diff toxin NEGATIVE NEGATIVE Final   C Diff interpretation No C. difficile detected.  Final    Comment: Performed at [facility], [address] [place], [place] [address]   Studies/Results: Ct Abdomen Pelvis W Contrast  Result Date: 09/06/2018 CLINICAL DATA:  Ongoing left lower quadrant pain and bloody diarrhea. Diagnosed with diverticulitis 1 week ago, persistent pain after completing antibiotics. EXAM: CT ABDOMEN AND PELVIS WITH CONTRAST TECHNIQUE: Multidetector CT imaging of the abdomen and pelvis was performed using the standard protocol following bolus administration of intravenous contrast. CONTRAST:  79mL OMNIPAQUE IOHEXOL 300 MG/ML  SOLN COMPARISON:  CT 10/30/2017 FINDINGS: Lower chest: Mild hypoventilatory atelectasis in the right lower lobe.  No pleural fluid. Hepatobiliary: Tiny subcapsular hypodensity in the right lobe of the liver, too small to characterize. Gallbladder physiologically distended, no calcified stone. No biliary dilatation. Pancreas: No ductal dilatation or inflammation. Spleen: Normal in size without focal abnormality. Adrenals/Urinary Tract: Normal adrenal glands. No hydronephrosis or perinephric edema. Homogeneous renal enhancement with symmetric excretion on delayed phase imaging.  Extrarenal pelvis configuration of the right kidney. Urinary bladder is physiologically distended without wall thickening. Stomach/Bowel: Persistent colonic wall thickening and pericolonic edema in the proximal sigmoid colon at site of prior diverticulitis. The inflamed diverticulum has decreased in size, and now contains a small focus of air. No abscess. Few additional noninflamed diverticulum in the distal colon. No new sites of active inflammation. Moderate stool burden in the more proximal colon. Normal appendix. No small bowel inflammation, wall thickening, or obstruction. No evidence of ileus. Vascular/Lymphatic: Abdominal aorta is normal in caliber. Mesenteric and portal veins are patent without thrombus. The IVC is unremarkable. No adenopathy. Reproductive: Post hysterectomy. Physiologic 2 cm cyst in the right ovary. Left ovary tentatively identified and normal. No adnexal mass. Other: No abscess, free air, or free fluid. Tiny fat containing umbilical hernia. Musculoskeletal: There are no acute or suspicious osseous abnormalities. IMPRESSION: Persistent diverticulitis of the proximal sigmoid colon. Overall improvement in inflammatory change from prior exam with residual colonic wall thickening and pericolonic/peri diverticular edema. No abscess or perforation. No new abnormalities. Electronically Signed   By: Narda RutherfordMelanie  Sanford M.D.   On: 09/06/2018 20:58   Medications:  I have reviewed the patient's current medications. Prior to Admission:  Medications Prior to Admission  Medication Sig Dispense Refill Last Dose  . acetaminophen-codeine (TYLENOL #3) 300-30 MG tablet Take 1 tablet by mouth every 4 (four) hours as needed for moderate pain.   prn at prn  . omeprazole (PRILOSEC) 20 MG capsule Take 20 mg by mouth daily.   09/06/2018 at Unknown time  . ondansetron (ZOFRAN) 4 MG tablet Take 1 tablet by mouth daily as needed.    prn at prn  . pregabalin (LYRICA) 50 MG capsule Take 50 mg by mouth at bedtime.    09/05/2018 at Unknown  . promethazine (PHENERGAN) 12.5 MG tablet Take 1 tablet by mouth at bedtime as needed.    prn at prn  . acetaminophen-codeine (TYLENOL #2) 300-15 MG tablet Take 1 tablet by mouth every 4 (four) hours as needed for moderate pain. (Patient not taking: Reported on 09/06/2018) 20 tablet 0 Completed Course at Unknown time  . traMADol (ULTRAM) 50 MG tablet Take 1 tablet (50 mg total) by mouth every 6 (six) hours as needed. (Patient not taking: Reported on 09/06/2018) 20 tablet 0 Not Taking at Unknown time   Scheduled: . acidophilus  1 capsule Oral TID  . enoxaparin (LOVENOX) injection  40 mg Subcutaneous Q24H  . metoprolol tartrate  25 mg Oral BID   Continuous: . ciprofloxacin 400 mg (09/08/18 1413)  . metronidazole 500 mg (09/08/18 1114)   ZOX:[id number]:acetaminophen **OR** acetaminophen, hydrALAZINE, morphine injection, ondansetron **OR** ondansetron (ZOFRAN) IV, traMADol Scheduled Meds: . acidophilus  1 capsule Oral TID  . enoxaparin (LOVENOX) injection  40 mg Subcutaneous Q24H  . metoprolol tartrate  25 mg Oral BID   Continuous Infusions: . ciprofloxacin 400 mg (09/08/18 1413)  . metronidazole 500 mg (09/08/18 1114)   PRN Meds:.acetaminophen **OR** acetaminophen, hydrALAZINE, morphine injection, ondansetron **OR** ondansetron (ZOFRAN) IV, traMADol   Assessment: Active Problems:   Diverticulitis    Plan: Continue antibiotics for 2 weeks total Follow-up  with GI as outpatient GI will sign off, please call us back with questions   LOS: 2 days   Duvan Mousel 09/08/2018, 5:02 PM

## 2018-09-09 LAB — CBC
HCT: 36 % (ref 36.0–46.0)
Hemoglobin: 11.2 g/dL — ABNORMAL LOW (ref 12.0–15.0)
MCH: 29 pg (ref 26.0–34.0)
MCHC: 31.1 g/dL (ref 30.0–36.0)
MCV: 93.3 fL (ref 80.0–100.0)
Platelets: 406 10*3/uL — ABNORMAL HIGH (ref 150–400)
RBC: 3.86 MIL/uL — AB (ref 3.87–5.11)
RDW: 13.2 % (ref 11.5–15.5)
WBC: 9.9 10*3/uL (ref 4.0–10.5)
nRBC: 0 % (ref 0.0–0.2)

## 2018-09-09 MED ORDER — CIPROFLOXACIN HCL 500 MG PO TABS: 500.0000 mg | ORAL_TABLET | Freq: Two times a day (BID) | ORAL | 0 refills | Status: AC

## 2018-09-09 MED ORDER — ONDANSETRON HCL 4 MG PO TABS: 4.0000 mg | ORAL_TABLET | Freq: Four times a day (QID) | ORAL | 0 refills | Status: AC | PRN

## 2018-09-09 MED ORDER — METRONIDAZOLE 500 MG PO TABS: 500.0000 mg | ORAL_TABLET | Freq: Three times a day (TID) | ORAL | 0 refills | Status: AC

## 2018-09-09 NOTE — Discharge Instructions (Signed)

## 2018-09-09 NOTE — Progress Notes (Signed)
09/09/2018 3:24 PM  Cheryl Blackburn to be D/C'd Home per MD order.  Discussed prescriptions and follow up appointments with the patient. Prescriptions given to patient, medication list explained in detail. Pt verbalized understanding.  Allergies as of 09/09/2018      Reactions   Clindamycin    Latex    Metronidazole       Medication List    TAKE these medications   acetaminophen-codeine 300-30 MG tablet Commonly known as:  TYLENOL #3 Take 1 tablet by mouth every 4 (four) hours as needed for moderate pain.   acetaminophen-codeine 300-15 MG tablet Commonly known as:  TYLENOL #2 Take 1 tablet by mouth every 4 (four) hours as needed for moderate pain.   ciprofloxacin 500 MG tablet Commonly known as:  CIPRO Take 1 tablet (500 mg total) by mouth 2 (two) times daily for 12 days.   metroNIDAZOLE 500 MG tablet Commonly known as:  FLAGYL Take 1 tablet (500 mg total) by mouth 3 (three) times daily for 12 days.   omeprazole 20 MG capsule Commonly known as:  PRILOSEC Take 20 mg by mouth daily.   ondansetron 4 MG tablet Commonly known as:  ZOFRAN Take 1 tablet by mouth daily as needed. What changed:  Another medication with the same name was added. Make sure you understand how and when to take each.   ondansetron 4 MG tablet Commonly known as:  ZOFRAN Take 1 tablet (4 mg total) by mouth every 6 (six) hours as needed for nausea. What changed:  You were already taking a medication with the same name, and this prescription was added. Make sure you understand how and when to take each.   pregabalin 50 MG capsule Commonly known as:  LYRICA Take 50 mg by mouth at bedtime.   promethazine 12.5 MG tablet Commonly known as:  PHENERGAN Take 1 tablet by mouth at bedtime as needed.   traMADol 50 MG tablet Commonly known as:  ULTRAM Take 1 tablet (50 mg total) by mouth every 6 (six) hours as needed.       Vitals:   09/09/18 1130 09/09/18 1130  BP: (!) 92/58 (!) 92/58  Pulse: 67 67   Resp: 16   Temp: 98.1 F (36.7 C) 98.1 F (36.7 C)  SpO2: 98% 98%    Skin clean, dry and intact without evidence of skin break down, no evidence of skin tears noted. IV catheter discontinued intact. Site without signs and symptoms of complications. Dressing and pressure applied. Pt denies pain at this time. No complaints noted.  An After Visit Summary was printed and given to the patient. Patient escorted via WC, and D/C home via private auto.  Bradly Chris

## 2018-09-09 NOTE — Discharge Summary (Addendum)
Patient seen and examined as described below with surgical PA-C, Gillermina PhyZachary Shulz.  I have personally reviewed the patient's chart, evaluated/examined the patient, proposed the recommended management, and discussed these recommendations with the patient to patient's expressed satisfaction as well as with patient's RN.  -- Scherrie GerlachJason E. Earlene Plateravis, MD, RPVI [place]: [facility] General Surgery - Partnering for exceptional care. Office: [phone]  [facility] SURGICAL DISCHARGE SUMMARY (cpt: [phone])  Patient ID: Cheryl MilletShelly Maffei MRN: [id number] DOB/AGE: 45/10/1975 45 y.o.  Admit date: 09/06/2018 Discharge date: 09/09/2018  Discharge Diagnoses Diverticulitis  Consultants Gastroenterology  Procedures None  HPI: Cheryl Blackburn  is a 43 y.o. female with a known history per below which includes recent diagnosis of diverticulitis, treated with Augmentin over the last 7 days with no improvement, presented emergency room with continued abdominal pain with nausea, generalized weakness, fatigue, and emergency room patient noted to be tachycardic, white count 12,000, CT abdomen noted for sigmoid diverticulitis, patient valuated emergency room, in mild discomfort due to pain, patient is now been admitted for acute sigmoid diverticulitis which is failed outpatient management  Hospital Course: The patient was admitted to the hospital for IV ABx and pain management. Over the course of the hospitalization the patient's pain and nausea improved/resolved and advancement of patient's diet and ambulation were well-tolerated. The remainder of patient's hospital course was essentially unremarkable, and discharge planning was initiated accordingly with patient safely able to be discharged home with appropriate discharge instructions, antibiotics, pain control, and outpatient  follow-up after all of her questions were answered to her expressed satisfaction.   Discharge Condition:  Good   Physical Examination:  Constitutional: Well appearing female, NAD Pulmonary: Normal respiratory effort, no acute distress Gastrointestinal: Soft, non-tender, non-distended Skin: warm and dry   Allergies as of 09/09/2018      Reactions   Clindamycin    Latex    Metronidazole       Medication List    TAKE these medications   acetaminophen-codeine 300-30 MG tablet Commonly known as:  TYLENOL #3 Take 1 tablet by mouth every 4 (four) hours as needed for moderate pain.   acetaminophen-codeine 300-15 MG tablet Commonly known as:  TYLENOL #2 Take 1 tablet by mouth every 4 (four) hours as needed for moderate pain.   ciprofloxacin 500 MG tablet Commonly known as:  CIPRO Take 1 tablet (500 mg total) by mouth 2 (two) times daily for 12 days.   metroNIDAZOLE 500 MG tablet Commonly known as:  FLAGYL Take 1 tablet (500 mg total) by mouth 3 (three) times daily for 12 days.   omeprazole 20 MG capsule Commonly known as:  PRILOSEC Take 20 mg by mouth daily.   ondansetron 4 MG tablet Commonly known as:  ZOFRAN Take 1 tablet by mouth daily as needed. What changed:  Another medication with the same name was added. Make sure you understand how and when to take each.   ondansetron 4 MG tablet Commonly known as:  ZOFRAN Take 1 tablet (4 mg total) by mouth every 6 (six) hours as needed for nausea. What changed:  You were already taking a medication with the same name, and this prescription was added. Make sure you understand how and when to take each.   pregabalin 50 MG capsule Commonly known as:  LYRICA Take 50 mg by mouth at bedtime.   promethazine 12.5 MG tablet Commonly known as:  PHENERGAN Take 1 tablet by mouth at bedtime as needed.   traMADol 50 MG tablet Commonly known  as:  ULTRAM Take 1 tablet (50 mg total) by mouth every 6 (six) hours as needed.        Follow-up Information    Vanga, Loel Dubonnet, MD. Schedule an appointment as soon as possible for a visit in 2  week(s).   Specialty:  Gastroenterology Contact information: [address] [place] [address] [phone]        Ancil Linsey, MD. Schedule an appointment as soon as possible for a visit in 2 week(s).   Specialty:  General Surgery Why:  diverticulitis Contact information: [address] [place] [address] [phone]            -- Lynden Oxford , PA-C [facility]  09/09/2018, 12:15 PM [phone] M-F: 7am - 4pm

## 2018-09-24 ENCOUNTER — Ambulatory Visit (INDEPENDENT_AMBULATORY_CARE_PROVIDER_SITE_OTHER): Payer: BLUE CROSS/BLUE SHIELD | Admitting: Surgery

## 2018-09-24 ENCOUNTER — Encounter: Payer: Self-pay | Admitting: Surgery

## 2018-09-24 ENCOUNTER — Other Ambulatory Visit: Payer: Self-pay

## 2018-09-24 VITALS — BP 139/85 | HR 110 | Temp 97.7°F | Ht 63.0 in | Wt 127.0 lb

## 2018-09-24 DIAGNOSIS — K5732 Diverticulitis of large intestine without perforation or abscess without bleeding: Secondary | ICD-10-CM | POA: Insufficient documentation

## 2018-09-24 NOTE — Patient Instructions (Addendum)
The patient is aware to call back for any questions or new concerns. Continue colace Keep appointment with Dr Allegra Lai on 10-07-18, please follow up after colonoscopy completed    Diverticulosis  Diverticulosis is a condition that develops when small pouches (diverticula) form in the wall of the large intestine (colon). The colon is where water is absorbed and stool is formed. The pouches form when the inside layer of the colon pushes through weak spots in the outer layers of the colon. You may have a few pouches or many of them. What are the causes? The cause of this condition is not known. What increases the risk? The following factors may make you more likely to develop this condition:  Being older than age 87. Your risk for this condition increases with age. Diverticulosis is rare among people younger than age 27. By age 54, many people have it.  Eating a low-fiber diet.  Having frequent constipation.  Being overweight.  Not getting enough exercise.  Smoking.  Taking over-the-counter pain medicines, like aspirin and ibuprofen.  Having a family history of diverticulosis. What are the signs or symptoms? In most people, there are no symptoms of this condition. If you do have symptoms, they may include:  Bloating.  Cramps in the abdomen.  Constipation or diarrhea.  Pain in the lower left side of the abdomen. How is this diagnosed? This condition is most often diagnosed during an exam for other colon problems. Because diverticulosis usually has no symptoms, it often cannot be diagnosed independently. This condition may be diagnosed by:  Using a flexible scope to examine the colon (colonoscopy).  Taking an X-ray of the colon after dye has been put into the colon (barium enema).  Doing a CT scan. How is this treated? You may not need treatment for this condition if you have never developed an infection related to diverticulosis. If you have had an infection before, treatment  may include:  Eating a high-fiber diet. This may include eating more fruits, vegetables, and grains.  Taking a fiber supplement.  Taking a live bacteria supplement (probiotic).  Taking medicine to relax your colon.  Taking antibiotic medicines. Follow these instructions at home:  Drink 6-8 glasses of water or more each day to prevent constipation.  Try not to strain when you have a bowel movement.  If you have had an infection before: ? Eat more fiber as directed by your health care provider or your diet and nutrition specialist (dietitian). ? Take a fiber supplement or probiotic, if your health care provider approves.  Take over-the-counter and prescription medicines only as told by your health care provider.  If you were prescribed an antibiotic, take it as told by your health care provider. Do not stop taking the antibiotic even if you start to feel better.  Keep all follow-up visits as told by your health care provider. This is important. Contact a health care provider if:  You have pain in your abdomen.  You have bloating.  You have cramps.  You have not had a bowel movement in 3 days. Get help right away if:  Your pain gets worse.  Your bloating becomes very bad.  You have a fever or chills, and your symptoms suddenly get worse.  You vomit.  You have bowel movements that are bloody or black.  You have bleeding from your rectum. Summary  Diverticulosis is a condition that develops when small pouches (diverticula) form in the wall of the large intestine (colon).  You may  have a few pouches or many of them.  This condition is most often diagnosed during an exam for other colon problems.  If you have had an infection related to diverticulosis, treatment may include increasing the fiber in your diet, taking supplements, or taking medicines. This information is not intended to replace advice given to you by your health care provider. Make sure you discuss any  questions you have with your health care provider. Document Released: 05/16/2004 Document Revised: 07/08/2016 Document Reviewed: 07/08/2016 Elsevier Interactive Patient Education  2019 [employer].

## 2018-09-24 NOTE — Progress Notes (Signed)
Surgical Clinic Progress/Follow-up Note   HPI:  43 y.o. Female presents to clinic for follow-up evaluation s/p recent hospital admission for sigmoid colonic diverticulitis that improved (on CT imaging), but did not completely resolve with initial outpatient 7-day course of oral antibiotics. Patient reports she experienced loose BM's and abdominal cramping, attributed to antibiotics (Cipro and Flagyl) and which have resolved with completion of her antibiotics as well as chronic bladder "pressure" attributed to chronic bladder disease (for which she's been treated), but the LLQ abdominal pain which she describes as "different and worse" than anything she'd experienced previously has completely resolved, and she continues to feel better after having completed her prescribed course of antibiotics. Her BM's have also continued to normalize with once daily colace without further constipation. Patient is scheduled for an appointment with Dr. Allegra Lai on 2/5 with colonoscopy anticipated to follow. Patient otherwise denies any fever/chills, N/V, CP, or SOB.  Review of Systems:  Constitutional: denies any other weight loss, fever, chills, or sweats  Eyes: denies any other vision changes, history of eye injury  ENT: denies sore throat, hearing problems  Respiratory: denies shortness of breath, wheezing  Cardiovascular: denies chest pain, palpitations  Gastrointestinal: abdominal pain, N/V, and bowel function as per HPI Musculoskeletal: denies any other joint pains or cramps  Skin: Denies any other rashes or skin discolorations  Neurological: denies any other headache, dizziness, weakness  Psychiatric: denies any other depression, anxiety  All other review of systems: otherwise negative   Vital Signs:  BP 139/85   Pulse (!) 110   Temp 97.7 F (36.5 C) (Skin)   Ht 5\' 3"  (1.6 m)   Wt 127 lb (57.6 kg)   BMI 22.50 kg/m    Physical Exam:  Constitutional:  -- Normal body habitus  -- Awake, alert, and  oriented x3  Eyes:  -- Pupils equally round and reactive to light  -- No scleral icterus  Ear, nose, throat:  -- No jugular venous distension  -- No nasal drainage, bleeding Pulmonary:  -- No crackles -- Equal breath sounds bilaterally -- Breathing non-labored at rest Cardiovascular:  -- S1, S2 present  -- No pericardial rubs  Gastrointestinal:  -- Soft, completely non-tender to palpation, and non-distended with no guarding/rebound tenderness  -- No abdominal masses appreciated, pulsatile or otherwise  Musculoskeletal / Integumentary:  -- Wounds or skin discoloration: None appreciated  -- Extremities: B/L UE and LE FROM, hands and feet warm, no edema  Neurologic:  -- Motor function: intact and symmetric  -- Sensation: intact and symmetric   Imaging: No new pertinent imaging studies available for review   Assessment:  43 y.o. yo Female with a problem list including...  Patient Active Problem List   Diagnosis Date Noted  . Diverticulitis 09/06/2018  . TOBACCO USER 01/10/2010  . SKIN RASH 01/10/2010    presents to clinic for follow-up evaluation, doing overall well with what appears to be resolved/resolving sigmoid colonic diverticulitis s/p recent hospital admission for same that had initially improved (on CT imaging), but did not completely resolve with initial outpatient 7-day course of oral antibiotics.  Plan:   - continue once daily colace, hydration, and high fiber diet  - natural history of diverticulosis and diverticulitis again reviewed  - return to clinic following patient's upcoming anticipated colonoscopy  - instructed to call office if any questions or concerns  All of the above recommendations were discussed with the patient, and all of patient's questions were answered to her expressed satisfaction.  -- Barbara Cower  Honor Loh, MD, RPVI [place]: [facility] - Partnering for exceptional care. Office: [phone]

## 2018-10-07 ENCOUNTER — Ambulatory Visit (INDEPENDENT_AMBULATORY_CARE_PROVIDER_SITE_OTHER): Payer: BLUE CROSS/BLUE SHIELD | Admitting: Gastroenterology

## 2018-10-07 ENCOUNTER — Other Ambulatory Visit: Payer: Self-pay

## 2018-10-07 ENCOUNTER — Encounter: Payer: Self-pay | Admitting: Gastroenterology

## 2018-10-07 VITALS — BP 129/73 | HR 118 | Resp 17 | Ht 63.0 in | Wt 125.8 lb

## 2018-10-07 DIAGNOSIS — K5732 Diverticulitis of large intestine without perforation or abscess without bleeding: Secondary | ICD-10-CM

## 2018-10-07 DIAGNOSIS — M7061 Trochanteric bursitis, right hip: Secondary | ICD-10-CM | POA: Insufficient documentation

## 2018-10-07 DIAGNOSIS — M7062 Trochanteric bursitis, left hip: Secondary | ICD-10-CM

## 2018-10-07 DIAGNOSIS — K601 Chronic anal fissure: Secondary | ICD-10-CM

## 2018-10-07 NOTE — Progress Notes (Signed)
[name], MD [address]  [address]  [place], [place] [address]  Main: [phone]  Fax: [phone]    Gastroenterology Consultation  Referring Provider:     [facility] Primary Care Physician:  [facility] Primary Gastroenterologist:  Dr. [name] Reason for Consultation:     Hospital follow-up, acute sigmoid diverticulitis        HPI:   Cheryl Blackburn is a 43 y.o. pleasant Caucasian female referred by [facility]  for consultation & management of recent hospitalization for acute diverticulitis.  She was seen [facility] ER last week of December due to persistent left lower quadrant pain and she was sent home on Augmentin.  CT revealed persistent diverticulitis of the sigmoid colon and she was started on Zosyn, later switched to Cipro and Flagyl.  She was seen by general surgery and no surgical intervention was recommended.  There was no evidence of contained abscess or perforation of the diverticulitis.  She was discharged home on Cipro and Flagyl.  Patient reports that her symptoms related to this episode has significantly improved.  She does have interstitial cystitis and has bladder pressure related to it.  She does report chronic sharp, stabbing pain in her anorectal area especially during a bowel movement.  She has history of chronic constipation which is now better regulated on stool softener.  She denies hard stools, significant straining during the BM.  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  She is also here to discuss about colonoscopy.  NSAIDs: None  Antiplts/Anticoagulants/Anti thrombotics: None  GI Procedures: None She denies family history of GI malignancy, inflammatory bowel disease She does not smoke or drink alcohol  Past Medical History:  Diagnosis Date  . Anemia   . Bursitis   . Chronic interstitial cystitis   . Diverticulitis   . Diverticulosis   . Fibromyalgia      Past Surgical History:  Procedure Laterality Date  . ABDOMINAL HYSTERECTOMY  2011    Current Outpatient Medications:  .  amphetamine-dextroamphetamine (ADDERALL) 30 MG tablet, Take 30 mg by mouth as needed (1-2 tab q 24 hr as needed)., Disp: , Rfl:  .  B Complex-C (SUPER B COMPLEX PO), Take by mouth daily., Disp: , Rfl:  .  cetirizine (ZYRTEC) 10 MG tablet, Take 10 mg by mouth daily., Disp: , Rfl:  .  docusate sodium (COLACE) 100 MG capsule, Take 100 mg by mouth daily., Disp: , Rfl:  .  Magnesium 65 MG TABS, Take by mouth daily., Disp: , Rfl:  .  omeprazole (PRILOSEC) 20 MG capsule, Take 20 mg by mouth daily., Disp: , Rfl:  .  ondansetron (ZOFRAN) 4 MG tablet, Take 1 tablet (4 mg total) by mouth every 6 (six) hours as needed for nausea., Disp: 20 tablet, Rfl: 0 .  Probiotic Product (PHILLIPS COLON HEALTH PO), Take by mouth 2 (two) times daily., Disp: , Rfl:  .  promethazine (PHENERGAN) 12.5 MG tablet, Take 1 tablet by mouth at bedtime as needed. , Disp: , Rfl:  .  acetaminophen-codeine (TYLENOL #3) 300-30 MG tablet, Take 1 tablet by mouth every 4 (four) hours as needed for moderate pain., Disp: , Rfl:  .  amoxicillin (AMOXIL) 500 MG capsule, , Disp: , Rfl:  .  methylPREDNISolone (MEDROL DOSEPAK) 4 MG TBPK tablet, , Disp: , Rfl:  .  montelukast (SINGULAIR) 10 MG tablet, , Disp: , Rfl:  .  pregabalin (LYRICA) 25 MG capsule, Take  25 mg by mouth 2 (two) times daily., Disp: , Rfl:   Family History  Problem Relation Age of Onset  . Breast cancer Maternal Aunt        4 mat aunts     Social History   Tobacco Use  . Smoking status: Former Smoker    Packs/day: 0.50    Years: 3.00    Pack years: 1.50    Types: Cigarettes    Last attempt to quit: 09/02/2010    Years since quitting: 8.1  . Smokeless tobacco: Never Used  Substance Use Topics  . Alcohol use: Never    Frequency: Never  . Drug use: Never    Allergies as of 10/07/2018 - Review Complete 10/07/2018  Allergen Reaction  Noted  . Clindamycin Itching   . Latex Rash   . Metronidazole Diarrhea     Review of Systems:    All systems reviewed and negative except where noted in HPI.   Physical Exam:  BP 129/73 (BP Location: Left Arm, Patient Position: Sitting, Cuff Size: Normal)   Pulse (!) 118   Resp 17   Ht 5\' 3"  (1.6 m)   Wt 125 lb 12.8 oz (57.1 kg)   BMI 22.28 kg/m  No LMP recorded. Patient has had a hysterectomy.  General:   Alert,  Well-developed, well-nourished, pleasant and cooperative in NAD Head:  Normocephalic and atraumatic. Eyes:  Sclera clear, no icterus.   Conjunctiva pink. Ears:  Normal auditory acuity. Nose:  No deformity, discharge, or lesions. Mouth:  No deformity or lesions,oropharynx pink & moist. Neck:  Supple; no masses or thyromegaly. Lungs:  Respirations even and unlabored.  Clear throughout to auscultation.   No wheezes, crackles, or rhonchi. No acute distress. Heart:  Regular rate and rhythm; no murmurs, clicks, rubs, or gallops. Abdomen:  Normal bowel sounds. Soft, non-tender and non-distended without masses, hepatosplenomegaly or hernias noted.  No guarding or rebound tenderness.   Rectal: Not performed Msk:  Symmetrical without gross deformities. Good, equal movement & strength bilaterally. Pulses:  Normal pulses noted. Extremities:  No clubbing or edema.  No cyanosis. Neurologic:  Alert and oriented x3;  grossly normal neurologically. Skin:  Intact without significant lesions or rashes. No jaundice. Psych:  Alert and cooperative. Normal mood and affect.  Imaging Studies: Reviewed  Assessment and Plan:   Cheryl Blackburn is a 43 y.o. pleasant Caucasian female with history of interstitial cystitis, with recent hospital admission for acute left-sided, uncomplicated diverticulitis treated with IV and oral antibiotics.  Currently, her symptoms are significantly improved.  She does have history of anal fissure  History of acute left-sided diverticulitis Recommend  colonoscopy as there is a small risk for underlying malignancy  Chronic anal fissure Recommend 0.25% Nitropaste with lidocaine, directions provided Regular bowel regimen to avoid constipation   Follow up based on the colonoscopy results   Arlyss Repressohini R Hawa Henly, MD

## 2018-10-26 ENCOUNTER — Encounter: Payer: Self-pay | Admitting: *Deleted

## 2018-10-26 ENCOUNTER — Other Ambulatory Visit: Payer: Self-pay

## 2018-10-26 ENCOUNTER — Ambulatory Visit: Payer: BLUE CROSS/BLUE SHIELD | Admitting: Certified Registered"

## 2018-10-26 ENCOUNTER — Encounter
Admission: RE | Disposition: A | Discharge status: Home / Self Care | Payer: Self-pay | Source: Home / Self Care | Attending: Gastroenterology

## 2018-10-26 ENCOUNTER — Ambulatory Visit
Admission: RE | Admit: 2018-10-26 | Discharge: 2018-10-26 | Disposition: A | Discharge status: Home / Self Care | Payer: BLUE CROSS/BLUE SHIELD | Attending: Gastroenterology | Admitting: Gastroenterology

## 2018-10-26 DIAGNOSIS — Z91040 Latex allergy status: Secondary | ICD-10-CM | POA: Insufficient documentation

## 2018-10-26 DIAGNOSIS — K5790 Diverticulosis of intestine, part unspecified, without perforation or abscess without bleeding: Secondary | ICD-10-CM | POA: Insufficient documentation

## 2018-10-26 DIAGNOSIS — Z881 Allergy status to other antibiotic agents status: Secondary | ICD-10-CM | POA: Diagnosis not present

## 2018-10-26 DIAGNOSIS — Z888 Allergy status to other drugs, medicaments and biological substances status: Secondary | ICD-10-CM | POA: Insufficient documentation

## 2018-10-26 DIAGNOSIS — M797 Fibromyalgia: Secondary | ICD-10-CM | POA: Diagnosis not present

## 2018-10-26 DIAGNOSIS — Z8719 Personal history of other diseases of the digestive system: Secondary | ICD-10-CM | POA: Diagnosis present

## 2018-10-26 DIAGNOSIS — Z87891 Personal history of nicotine dependence: Secondary | ICD-10-CM | POA: Diagnosis not present

## 2018-10-26 DIAGNOSIS — K5732 Diverticulitis of large intestine without perforation or abscess without bleeding: Secondary | ICD-10-CM

## 2018-10-26 HISTORY — PX: COLONOSCOPY WITH PROPOFOL: SHX5780

## 2018-10-26 SURGERY — COLONOSCOPY WITH PROPOFOL
Anesthesia: General

## 2018-10-26 MED ORDER — PROPOFOL 10 MG/ML IV BOLUS
INTRAVENOUS | Status: AC
  Filled 2018-10-26: qty 20

## 2018-10-26 MED ORDER — PROPOFOL 10 MG/ML IV BOLUS
INTRAVENOUS | Status: DC | PRN
  Administered 2018-10-26 (×6): 50 mg via INTRAVENOUS

## 2018-10-26 MED ORDER — SODIUM CHLORIDE 0.9 % IV SOLN
INTRAVENOUS | Status: DC
  Administered 2018-10-26: 10:00:00 via INTRAVENOUS

## 2018-10-26 NOTE — Anesthesia Post-op Follow-up Note (Signed)
Anesthesia QCDR form completed.        

## 2018-10-26 NOTE — Transfer of Care (Signed)
Immediate Anesthesia Transfer of Care Note  Patient: Cheryl Blackburn  Procedure(s) Performed: COLONOSCOPY WITH PROPOFOL (N/A )  Patient Location: Endoscopy Unit  Anesthesia Type:General  Level of Consciousness: drowsy and responds to stimulation  Airway & Oxygen Therapy: Patient Spontanous Breathing and Patient connected to nasal cannula oxygen  Post-op Assessment: Report given to RN and Post -op Vital signs reviewed and stable  Post vital signs: Reviewed and stable  Last Vitals:  Vitals Value Taken Time  BP    Temp    Pulse 85 10/26/2018 10:19 AM  Resp 17 10/26/2018 10:19 AM  SpO2 100 % 10/26/2018 10:19 AM  Vitals shown include unvalidated device data.  Last Pain:  Vitals:   10/26/18 0927  TempSrc: Tympanic  PainSc: 0-No pain         Complications: No apparent anesthesia complications

## 2018-10-26 NOTE — Anesthesia Preprocedure Evaluation (Signed)
Anesthesia Evaluation  Patient identified by MRN, date of birth, ID band Patient awake    Reviewed: Allergy & Precautions, H&P , NPO status , Patient's Chart, lab work & pertinent test results  History of Anesthesia Complications Negative for: history of anesthetic complications  Airway Mallampati: II  TM Distance: >3 FB Neck ROM: full    Dental  (+) Chipped, Caps, Poor Dentition   Pulmonary neg pulmonary ROS, neg shortness of breath, former smoker,           Cardiovascular Exercise Tolerance: Good (-) angina(-) Past MI and (-) DOE negative cardio ROS       Neuro/Psych  Neuromuscular disease negative psych ROS   GI/Hepatic negative GI ROS, Neg liver ROS, neg GERD  ,  Endo/Other  negative endocrine ROS  Renal/GU negative Renal ROS  negative genitourinary   Musculoskeletal  (+) Arthritis , Fibromyalgia -  Abdominal   Peds  Hematology negative hematology ROS (+)   Anesthesia Other Findings Past Medical History: No date: Anemia No date: Bursitis No date: Chronic interstitial cystitis No date: Diverticulitis No date: Diverticulosis No date: Fibromyalgia  Past Surgical History: 2011: ABDOMINAL HYSTERECTOMY  BMI    Body Mass Index:  21.97 kg/m      Reproductive/Obstetrics negative OB ROS                             Anesthesia Physical Anesthesia Plan  ASA: II  Anesthesia Plan: General   Post-op Pain Management:    Induction: Intravenous  PONV Risk Score and Plan: Propofol infusion and TIVA  Airway Management Planned: Natural Airway and Nasal Cannula  Additional Equipment:   Intra-op Plan:   Post-operative Plan:   Informed Consent: I have reviewed the patients History and Physical, chart, labs and discussed the procedure including the risks, benefits and alternatives for the proposed anesthesia with the patient or authorized representative who has indicated his/her  understanding and acceptance.     Dental Advisory Given  Plan Discussed with: Anesthesiologist, CRNA and Surgeon  Anesthesia Plan Comments: (Patient consented for risks of anesthesia including but not limited to:  - adverse reactions to medications - risk of intubation if required - damage to teeth, lips or other oral mucosa - sore throat or hoarseness - Damage to heart, brain, lungs or loss of life  Patient voiced understanding.)        Anesthesia Quick Evaluation

## 2018-10-26 NOTE — H&P (Signed)
[name], MD [address]  [address]  [place], [place] [address]  Main: [phone]  Fax: [phone] Pager: [phone]  Primary Care Physician:  [facility] Primary Gastroenterologist:  Dr. [name]  Pre-Procedure History & Physical: HPI:  Cheryl Blackburn is a 43 y.o. female is here for an colonoscopy.   Past Medical History:  Diagnosis Date  . Anemia   . Bursitis   . Chronic interstitial cystitis   . Diverticulitis   . Diverticulosis   . Fibromyalgia     Past Surgical History:  Procedure Laterality Date  . ABDOMINAL HYSTERECTOMY  2011    Prior to Admission medications   Medication Sig Start Date End Date Taking? Authorizing Provider  acetaminophen-codeine (TYLENOL #3) 300-30 MG tablet Take 1 tablet by mouth every 4 (four) hours as needed for moderate pain.   Yes [provider]  amphetamine-dextroamphetamine (ADDERALL) 30 MG tablet Take 30 mg by mouth as needed (1-2 tab q 24 hr as needed).   Yes [provider]  B Complex-C (SUPER B COMPLEX PO) Take by mouth daily.   Yes [provider]  cetirizine (ZYRTEC) 10 MG tablet Take 10 mg by mouth daily.   Yes [provider]  docusate sodium (COLACE) 100 MG capsule Take 100 mg by mouth daily.   Yes [provider]  Magnesium 65 MG TABS Take by mouth daily.   Yes [provider]  methylPREDNISolone (MEDROL DOSEPAK) 4 MG TBPK tablet  04/29/18  Yes [provider]  montelukast (SINGULAIR) 10 MG tablet  05/14/18  Yes [provider]  omeprazole (PRILOSEC) 20 MG capsule Take 20 mg by mouth daily.   Yes [provider]  ondansetron (ZOFRAN) 4 MG tablet Take 1 tablet (4 mg total) by mouth every 6 (six) hours as needed for nausea. 09/09/18  Yes Donovan Kail, PA-C  pregabalin (LYRICA) 25 MG capsule Take 25 mg by mouth 2 (two) times daily.   Yes [provider]  Probiotic Product (PHILLIPS COLON HEALTH PO)  Take by mouth 2 (two) times daily.   Yes [provider]  promethazine (PHENERGAN) 12.5 MG tablet Take 1 tablet by mouth at bedtime as needed.    Yes [provider]  amoxicillin (AMOXIL) 500 MG capsule  04/30/18   [provider]    Allergies as of 10/07/2018 - Review Complete 10/07/2018  Allergen Reaction Noted  . Clindamycin Itching   . Latex Rash   . Metronidazole Diarrhea     Family History  Problem Relation Age of Onset  . Breast cancer Maternal Aunt        4 mat aunts    Social History   Socioeconomic History  . Marital status: Married    Spouse name: Not on file  . Number of children: Not on file  . Years of education: Not on file  . Highest education level: Not on file  Occupational History  . Not on file  Social Needs  . Financial resource strain: Not on file  . Food insecurity:    Worry: Not on file    Inability: Not on file  . Transportation needs:    Medical: Not on file    Non-medical: Not on file  Tobacco Use  . Smoking status: Former Smoker    Packs/day: 0.50    Years: 3.00    Pack years: 1.50    Types: Cigarettes    Last attempt to quit: 09/02/2010    Years  since quitting: 8.1  . Smokeless tobacco: Never Used  Substance and Sexual Activity  . Alcohol use: Never    Frequency: Never  . Drug use: Never  . Sexual activity: Not on file  Lifestyle  . Physical activity:    Days per week: Not on file    Minutes per session: Not on file  . Stress: Not on file  Relationships  . Social connections:    Talks on phone: Not on file    Gets together: Not on file    Attends religious service: Not on file    Active member of club or organization: Not on file    Attends meetings of clubs or organizations: Not on file    Relationship status: Not on file  . Intimate partner violence:    Fear of current or ex partner: Not on file    Emotionally abused: Not on file    Physically abused: Not on file    Forced sexual activity: Not on  file  Other Topics Concern  . Not on file  Social History Narrative  . Not on file    Review of Systems: See HPI, otherwise negative ROS  Physical Exam: BP (!) 113/52   Pulse 78   Temp (!) 96.7 F (35.9 C) (Tympanic)   Resp 18   Ht 5\' 3"  (1.6 m)   Wt 56.2 kg   SpO2 100%   BMI 21.97 kg/m  General:   Alert,  pleasant and cooperative in NAD Head:  Normocephalic and atraumatic. Neck:  Supple; no masses or thyromegaly. Lungs:  Clear throughout to auscultation.    Heart:  Regular rate and rhythm. Abdomen:  Soft, nontender and nondistended. Normal bowel sounds, without guarding, and without rebound.   Neurologic:  Alert and  oriented x4;  grossly normal neurologically.  Impression/Plan: Cheryl Blackburn is here for an colonoscopy to be performed for h/o acute sigmoid diverticulitis  Risks, benefits, limitations, and alternatives regarding  colonoscopy have been reviewed with the patient.  Questions have been answered.  All parties agreeable.   Lannette Donath, MD  10/26/2018, 9:36 AM

## 2018-10-26 NOTE — Op Note (Signed)
[facility] Gastroenterology Patient Name: Cheryl Blackburn Procedure Date: 10/26/2018 9:42 AM MRN: [id number] Account #: [id number] Date of Birth: 09-27-1975 Admit Type: Outpatient Age: 43 Room: [facility] ENDO ROOM 3 Gender: Female Note Status: Finalized Procedure:            Colonoscopy Indications:          Follow-up after acute sigmoid diverticulitis Providers:            Lin Landsman MD, MD Referring MD:         No Local Md, MD (Referring MD) Medicines:            General Anesthesia Complications:        No immediate complications. Estimated blood loss: None. Procedure:            Pre-Anesthesia Assessment:                       - Prior to the procedure, a History and Physical was                        performed, and patient medications and allergies were                        reviewed. The patient is competent. The risks and                        benefits of the procedure and the sedation options and                        risks were discussed with the patient. All questions                        were answered and informed consent was obtained.                        Patient identification and proposed procedure were                        verified by the physician, the nurse, the                        anesthesiologist, the anesthetist and the technician in                        the pre-procedure area in the procedure room in the                        endoscopy suite. Mental Status Examination: alert and                        oriented. Airway Examination: normal oropharyngeal                        airway and neck mobility. Respiratory Examination:                        clear to auscultation. CV Examination: normal.                        Prophylactic Antibiotics: The patient does not require  prophylactic antibiotics. Prior Anticoagulants: The                        patient has taken no previous anticoagulant or        antiplatelet agents. ASA Grade Assessment: II - A                        patient with mild systemic disease. After reviewing the                        risks and benefits, the patient was deemed in                        satisfactory condition to undergo the procedure. The                        anesthesia plan was to use monitored anesthesia care                        (MAC). Immediately prior to administration of                        medications, the patient was re-assessed for adequacy                        to receive sedatives. The heart rate, respiratory rate,                        oxygen saturations, blood pressure, adequacy of                        pulmonary ventilation, and response to care were                        monitored throughout the procedure. The physical status                        of the patient was re-assessed after the procedure.                       After obtaining informed consent, the colonoscope was                        passed under direct vision. Throughout the procedure,                        the patient's blood pressure, pulse, and oxygen                        saturations were monitored continuously. The                        Colonoscope was introduced through the anus and                        advanced to the the cecum, identified by appendiceal                        orifice and ileocecal valve. The colonoscopy was  performed without difficulty. The patient tolerated the                        procedure well. The quality of the bowel preparation                        was evaluated using the BBPS [facility] Bowel Preparation                        Scale) with scores of: Right Colon = 2 (minor amount of                        residual staining, small fragments of stool and/or                        opaque liquid, but mucosa seen well), Transverse Colon                        = 2 (minor amount of residual staining, small  fragments                        of stool and/or opaque liquid, but mucosa seen well)                        and Left Colon = 2 (minor amount of residual staining,                        small fragments of stool and/or opaque liquid, but                        mucosa seen well). The total BBPS score equals 6. Findings:      The perianal and digital rectal examinations were normal. Pertinent       negatives include normal sphincter tone and no palpable rectal lesions.      A few diverticula were found in the sigmoid colon and ascending colon.      The exam was otherwise without abnormality.      The retroflexed view of the distal rectum and anal verge was normal and       showed no anal or rectal abnormalities.      The terminal ileum appeared normal. Impression:           - Diverticulosis in the sigmoid colon and in the                        ascending colon.                       - The examination was otherwise normal.                       - The distal rectum and anal verge are normal on                        retroflexion view.                       - No specimens collected. Recommendation:       - Discharge patient to home (with escort).                       -  Resume previous diet today.                       - Continue present medications.                       - Repeat colonoscopy at age 9 for screening purposes. Procedure Code(s):    --- Professional ---                       [id number], Colonoscopy, flexible; diagnostic, including                        collection of specimen(s) by brushing or washing, when                        performed (separate procedure) Diagnosis Code(s):    --- Professional ---                       W10.93, Diverticulitis of large intestine without                        perforation or abscess without bleeding                       K57.30, Diverticulosis of large intestine without                        perforation or abscess without bleeding CPT  copyright 2018 American Medical Association. All rights reserved. The codes documented in this report are preliminary and upon coder review may  be revised to meet current compliance requirements. Dr. Ulyess Mort Lin Landsman MD, MD 10/26/2018 10:18:02 AM This report has been signed electronically. Number of Addenda: 0 Note Initiated On: 10/26/2018 9:42 AM Scope Withdrawal Time: 0 hours 12 minutes 3 seconds  Total Procedure Duration: 0 hours 16 minutes 8 seconds       [facility]

## 2018-10-26 NOTE — Anesthesia Postprocedure Evaluation (Signed)
Anesthesia Post Note  Patient: Production designer, theatre/television/film  Procedure(s) Performed: COLONOSCOPY WITH PROPOFOL (N/A )  Patient location during evaluation: Endoscopy Anesthesia Type: General Level of consciousness: awake and alert Pain management: pain level controlled Vital Signs Assessment: post-procedure vital signs reviewed and stable Respiratory status: spontaneous breathing, nonlabored ventilation, respiratory function stable and patient connected to nasal cannula oxygen Cardiovascular status: blood pressure returned to baseline and stable Postop Assessment: no apparent nausea or vomiting Anesthetic complications: no     Last Vitals:  Vitals:   10/26/18 1039 10/26/18 1049  BP: (!) 97/55 100/76  Pulse: 78 69  Resp: 15 14  Temp:    SpO2: 100% 100%    Last Pain:  Vitals:   10/26/18 1049  TempSrc:   PainSc: 0-No pain                 Cleda Mccreedy Piscitello

## 2018-11-05 ENCOUNTER — Ambulatory Visit: Payer: BLUE CROSS/BLUE SHIELD | Admitting: Surgery

## 2019-01-05 ENCOUNTER — Ambulatory Visit: Payer: BLUE CROSS/BLUE SHIELD | Admitting: Gastroenterology

## 2019-01-13 ENCOUNTER — Telehealth: Payer: Self-pay | Admitting: Gastroenterology

## 2019-01-13 DIAGNOSIS — K5732 Diverticulitis of large intestine without perforation or abscess without bleeding: Secondary | ICD-10-CM

## 2019-01-13 MED ORDER — METRONIDAZOLE 500 MG PO TABS: 500.0000 mg | ORAL_TABLET | Freq: Two times a day (BID) | ORAL | 0 refills | Status: AC

## 2019-01-13 MED ORDER — CIPROFLOXACIN HCL 500 MG PO TABS: 500.0000 mg | ORAL_TABLET | Freq: Two times a day (BID) | ORAL | 0 refills | Status: AC

## 2019-01-13 NOTE — Telephone Encounter (Signed)
Pt left vm she states she is a pt of Dr. Allegra Lai she went to her PCP and had Labs  Her White blood count has been elevated and she states she has had a low grade fever 99.1 work weeks now she states her PCP wants to refer her to a Hematologist but she is unsure if she needs to go that far she thinks she may have an infection from her diverticulitis she would like to discuss this

## 2019-01-13 NOTE — Telephone Encounter (Signed)
Returned patient's call.  She reports that she has been having intermittent low-grade fever, left lower quadrant pain.  She had routine labs done by her PCP to follow-up on leukocytosis from the past and she was found to have WBC count between 11 and 12.  She thinks she has another attack of diverticulitis similar to what she experienced in 09/2018.  She also underwent colonoscopy by me which revealed mild sigmoid diverticulosis only  I think it is reasonable to try 2 weeks course of antibiotics for probable sigmoid diverticulitis Patient is agreeable and she preferred to postpone seeing hematology at this time and see how she responds to antibiotics  She does not have allergy to metronidazole, developed diarrhea only in the past.  Patient requested me to remove it from her allergy list.  Prescribed 2 weeks course of Cipro and Flagyl  [name], MD [address]  [address]  [place], [place] [address]  Main: [phone]  Fax: [phone] Pager: [phone]

## 2019-01-13 NOTE — Telephone Encounter (Signed)
Dr. Allegra Lai please advise for previous message

## 2021-03-13 ENCOUNTER — Other Ambulatory Visit: Payer: Self-pay

## 2021-09-20 ENCOUNTER — Other Ambulatory Visit: Payer: Self-pay | Admitting: Physician Assistant

## 2021-09-20 DIAGNOSIS — M47896 Other spondylosis, lumbar region: Secondary | ICD-10-CM

## 2021-09-20 DIAGNOSIS — M542 Cervicalgia: Secondary | ICD-10-CM

## 2021-10-08 ENCOUNTER — Ambulatory Visit
Admission: RE | Admit: 2021-10-08 | Discharge: 2021-10-08 | Disposition: A | Discharge status: Home / Self Care | Payer: Managed Care, Other (non HMO) | Source: Ambulatory Visit | Attending: Physician Assistant | Admitting: Physician Assistant

## 2021-10-08 DIAGNOSIS — M47896 Other spondylosis, lumbar region: Secondary | ICD-10-CM

## 2021-10-08 DIAGNOSIS — M542 Cervicalgia: Secondary | ICD-10-CM
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
## 2022-08-12 ENCOUNTER — Ambulatory Visit
Admission: RE | Admit: 2022-08-12 | Discharge: 2022-08-12 | Disposition: A | Discharge status: Home / Self Care | Payer: Managed Care, Other (non HMO) | Source: Ambulatory Visit | Attending: Nurse Practitioner | Admitting: Nurse Practitioner

## 2022-08-12 ENCOUNTER — Other Ambulatory Visit: Payer: Self-pay | Admitting: Nurse Practitioner

## 2022-08-12 DIAGNOSIS — Z0000 Encounter for general adult medical examination without abnormal findings: Secondary | ICD-10-CM

## 2022-10-04 DIAGNOSIS — M5412 Radiculopathy, cervical region: Secondary | ICD-10-CM | POA: Diagnosis not present

## 2022-11-04 ENCOUNTER — Ambulatory Visit
Admission: RE | Admit: 2022-11-04 | Discharge: 2022-11-04 | Disposition: A | Discharge status: Home / Self Care | Payer: 59 | Source: Ambulatory Visit | Attending: Physician Assistant | Admitting: Physician Assistant

## 2022-11-04 VITALS — BP 137/83 | HR 96 | Temp 98.3°F | Resp 14

## 2022-11-04 DIAGNOSIS — J0100 Acute maxillary sinusitis, unspecified: Secondary | ICD-10-CM | POA: Diagnosis not present

## 2022-11-04 DIAGNOSIS — H6502 Acute serous otitis media, left ear: Secondary | ICD-10-CM

## 2022-11-04 DIAGNOSIS — J069 Acute upper respiratory infection, unspecified: Secondary | ICD-10-CM | POA: Diagnosis not present

## 2022-11-04 MED ORDER — CEFUROXIME AXETIL 250 MG PO TABS: 250.0000 mg | ORAL_TABLET | Freq: Two times a day (BID) | ORAL | 0 refills | Status: AC

## 2022-11-04 MED ORDER — ALBUTEROL SULFATE HFA 108 (90 BASE) MCG/ACT IN AERS: 2.0000 | INHALATION_SPRAY | Freq: Four times a day (QID) | RESPIRATORY_TRACT | 0 refills | Status: AC | PRN

## 2022-11-04 MED ORDER — SPACER/AERO-HOLDING CHAMBERS DEVI: 1.0000 [IU] | Freq: Three times a day (TID) | 0 refills | Status: AC

## 2022-11-04 NOTE — ED Provider Notes (Signed)
[name]    [name]: [id number] Arrival date & time: 11/04/22  1052      History   Chief Complaint Chief Complaint  Patient presents with   Ear Fullness    I had the flu last week. Without fever since Saturday morning. I believe I have a secondary infection  sinus, ears and throat issues now. - Entered by patient    HPI Cheryl Blackburn is a 47 y.o. female.   47 year old female presents with shortness of breath, left ear pain, sinus congestion.  Patient indicates that she had the flu last week and Saturday she was without fever.  She indicates she still has considerable upper respiratory sinus congestion with pressure frontal and maxillary areas, postnasal drip, rhinitis.  Patient also indicates she is having bilateral ear pressure with left ear pain.  She indicates she does have a history of having ETD and this occasionally gives her a problem feeling congested.  Patient indicates she has had some mild off-balance and dizziness due to the sinus congestion.  She also indicates having mild chest congestion with mild wheezing on exhale, mild shortness of breath.  She is without fever, nausea or vomiting.  She has taken over-the-counter medicines with minimal relief.   Ear Fullness    Past Medical History:  Diagnosis Date   Anemia    Bursitis    Chronic interstitial cystitis    Diverticulitis    Diverticulosis    Fibromyalgia     Patient Active Problem List   Diagnosis Date Noted   Diverticulitis of sigmoid colon    Trochanteric bursitis of both hips 10/07/2018   Diverticulitis of large intestine without perforation or abscess without bleeding 09/24/2018   Fibromyalgia 06/02/2018   Cervical spondylosis 06/30/2017   SKIN RASH 01/10/2010    Past Surgical History:  Procedure Laterality Date   ABDOMINAL HYSTERECTOMY  2011   CERVICAL SPINE SURGERY     COLONOSCOPY WITH PROPOFOL N/A 10/26/2018   Procedure: COLONOSCOPY WITH PROPOFOL;  Surgeon: Lin Landsman, MD;   Location: [facility];  Service: Gastroenterology;  Laterality: N/A;    OB History     Gravida  1   Para  1   Term      Preterm      AB      Living         SAB      IAB      Ectopic      Multiple      Live Births           Obstetric Comments  1st Menstrual Cycle:   9 1st Pregnancy:  27           Home Medications    Prior to Admission medications   Medication Sig Start Date End Date Taking? Authorizing Provider  albuterol (VENTOLIN HFA) 108 (90 Base) MCG/ACT inhaler Inhale 2 puffs into the lungs every 6 (six) hours as needed for wheezing or shortness of breath. 11/04/22  Yes Nyoka Lint, PA-C  cefUROXime (CEFTIN) 250 MG tablet Take 1 tablet (250 mg total) by mouth 2 (two) times daily with a meal. 11/04/22  Yes Nyoka Lint, PA-C  Spacer/Aero-Holding Chambers DEVI 1 Units by Does not apply route in the morning, at noon, and at bedtime. 11/04/22  Yes Nyoka Lint, PA-C  acetaminophen-codeine (TYLENOL #3) 300-30 MG tablet Take 1 tablet by mouth every 4 (four) hours as needed for moderate pain. Patient not taking: Reported on 11/04/2022    [provider]  amphetamine-dextroamphetamine (ADDERALL) 30 MG tablet Take 30 mg by mouth as needed (1-2 tab q 24 hr as needed).    [provider]  B Complex-C (SUPER B COMPLEX PO) Take by mouth daily. Patient not taking: Reported on 11/04/2022    [provider]  cetirizine (ZYRTEC) 10 MG tablet Take 10 mg by mouth daily. Patient not taking: Reported on 11/04/2022    [provider]  docusate sodium (COLACE) 100 MG capsule Take 100 mg by mouth daily. Patient not taking: Reported on 11/04/2022    [provider]  Magnesium 65 MG TABS Take by mouth daily. Patient not taking: Reported on 11/04/2022    [provider]  methylPREDNISolone (MEDROL DOSEPAK) 4 MG TBPK tablet  04/29/18   [provider]  montelukast (SINGULAIR) 10 MG tablet  05/14/18   [provider]   omeprazole (PRILOSEC) 20 MG capsule Take 20 mg by mouth daily. Patient not taking: Reported on 11/04/2022    [provider]  ondansetron (ZOFRAN) 4 MG tablet Take 1 tablet (4 mg total) by mouth every 6 (six) hours as needed for nausea. Patient not taking: Reported on 11/04/2022 09/09/18   Tylene Fantasia, PA-C  pregabalin (LYRICA) 25 MG capsule Take 25 mg by mouth 2 (two) times daily. Patient not taking: Reported on 11/04/2022    [provider]  Probiotic Product ([place]) Take by mouth 2 (two) times daily. Patient not taking: Reported on 11/04/2022    [provider]  promethazine (PHENERGAN) 12.5 MG tablet Take 1 tablet by mouth at bedtime as needed.  Patient not taking: Reported on 11/04/2022    [provider]    Family History Family History  Problem Relation Age of Onset   Breast cancer Maternal Aunt        4 mat aunts    Social History Social History   Tobacco Use   Smoking status: Former    Packs/day: 0.50    Years: 3.00    Total pack years: 1.50    Types: Cigarettes    Quit date: 09/02/2010    Years since quitting: 12.1   Smokeless tobacco: Never  Vaping Use   Vaping Use: Never used  Substance Use Topics   Alcohol use: Never   Drug use: Never     Allergies   Clindamycin and Latex   Review of Systems Review of Systems  HENT:  Positive for postnasal drip, sinus pressure and sinus pain.      Physical Exam Triage Vital Signs ED Triage Vitals  Enc Vitals Group     BP 11/04/22 1127 137/83     Pulse Rate 11/04/22 1127 96     Resp 11/04/22 1127 14     Temp 11/04/22 1127 98.3 F (36.8 C)     Temp Source 11/04/22 1127 Oral     SpO2 11/04/22 1127 98 %     Weight --      Height --      Head Circumference --      Peak Flow --      Pain Score 11/04/22 1124 0     Pain Loc --      Pain Edu? --      Excl. in [place]? --    No data found.  Updated Vital Signs BP 137/83 (BP Location: Right Arm)   Pulse 96   Temp  98.3 F (36.8 C) (Oral)   Resp 14   SpO2 98%  Visual Acuity Right Eye Distance:   Left Eye Distance:   Bilateral Distance:    Right Eye Near:   Left Eye Near:    Bilateral Near:     Physical Exam Constitutional:      Appearance: Normal appearance.  HENT:     Right Ear: Ear canal normal. Tympanic membrane is injected.     Left Ear: Ear canal normal. Tympanic membrane is erythematous.     Mouth/Throat:     Mouth: Mucous membranes are moist.     Pharynx: Oropharynx is clear.  Cardiovascular:     Rate and Rhythm: Normal rate and regular rhythm.     Heart sounds: Normal heart sounds.  Pulmonary:     Effort: Pulmonary effort is normal.     Breath sounds: Normal breath sounds and air entry. No wheezing, rhonchi or rales.  Lymphadenopathy:     Cervical: No cervical adenopathy.  Neurological:     Mental Status: She is alert.      UC Treatments / Results  Labs (all labs ordered are listed, but only abnormal results are displayed) Labs Reviewed - No data to display  EKG   Radiology No results found.  Procedures Procedures (including critical care time)  Medications Ordered in UC Medications - No data to display  Initial Impression / Assessment and Plan / UC Course  I have reviewed the triage vital signs and the nursing notes.  Pertinent labs & imaging results that were available during my care of the patient were reviewed by me and considered in my medical decision making (see chart for details).    Plan: The diagnosis will be treated with the following: 1.  Upper respiratory infection: A.  Advised to continue using nasal spray and low-dose Sudafed for congestion. 2.  Maxillary sinusitis: A.  Ceftin 250 mg twice daily to treat infection. 3.  Left otitis media: A.  Ceftin 250 mg twice daily to treat infection. 4.  Advised follow-up PCP return to urgent care as needed. Final Clinical Impressions(s) / UC Diagnoses   Final diagnoses:  Acute upper respiratory  infection  Acute non-recurrent maxillary sinusitis  Non-recurrent acute serous otitis media of left ear     Discharge Instructions      Use albuterol with spacer, 2 puffs every 6-8 hours as needed for wheezing and shortness of breath. Advised take Ceftin 250 mg twice daily to treat the ear infection.  Advised to follow-up with PCP or return to urgent care if symptoms fail to improve.    ED Prescriptions     Medication Sig Dispense Auth. Provider   cefUROXime (CEFTIN) 250 MG tablet Take 1 tablet (250 mg total) by mouth 2 (two) times daily with a meal. 14 tablet Nyoka Lint, PA-C   albuterol (VENTOLIN HFA) 108 (90 Base) MCG/ACT inhaler Inhale 2 puffs into the lungs every 6 (six) hours as needed for wheezing or shortness of breath. 8 g Nyoka Lint, PA-C   Spacer/Aero-Holding Chambers DEVI 1 Units by Does not apply route in the morning, at noon, and at bedtime. 1 Units Nyoka Lint, PA-C      PDMP not reviewed this encounter.   Nyoka Lint, PA-C 11/04/22 1149

## 2022-11-04 NOTE — ED Triage Notes (Signed)
Pt presents with c/o continued SOB and ear fullness since having the flu last week

## 2022-11-04 NOTE — Discharge Instructions (Signed)
Use albuterol with spacer, 2 puffs every 6-8 hours as needed for wheezing and shortness of breath. Advised take Ceftin 250 mg twice daily to treat the ear infection.  Advised to follow-up with PCP or return to urgent care if symptoms fail to improve.

## 2022-11-15 DIAGNOSIS — M5412 Radiculopathy, cervical region: Secondary | ICD-10-CM | POA: Diagnosis not present

## 2023-02-07 DIAGNOSIS — M79642 Pain in left hand: Secondary | ICD-10-CM | POA: Diagnosis not present

## 2023-02-07 DIAGNOSIS — M67442 Ganglion, left hand: Secondary | ICD-10-CM | POA: Diagnosis not present

## 2023-02-07 DIAGNOSIS — M79645 Pain in left finger(s): Secondary | ICD-10-CM | POA: Diagnosis not present

## 2023-03-07 DIAGNOSIS — Z981 Arthrodesis status: Secondary | ICD-10-CM | POA: Diagnosis not present

## 2023-04-08 DIAGNOSIS — M7711 Lateral epicondylitis, right elbow: Secondary | ICD-10-CM | POA: Diagnosis not present

## 2023-04-29 DIAGNOSIS — M67442 Ganglion, left hand: Secondary | ICD-10-CM | POA: Diagnosis not present

## 2023-06-06 DIAGNOSIS — M25742 Osteophyte, left hand: Secondary | ICD-10-CM | POA: Diagnosis not present

## 2023-06-06 DIAGNOSIS — M67442 Ganglion, left hand: Secondary | ICD-10-CM | POA: Diagnosis not present

## 2023-07-03 DIAGNOSIS — R5383 Other fatigue: Secondary | ICD-10-CM | POA: Diagnosis not present

## 2023-07-03 DIAGNOSIS — F900 Attention-deficit hyperactivity disorder, predominantly inattentive type: Secondary | ICD-10-CM | POA: Diagnosis not present

## 2023-07-25 DIAGNOSIS — M542 Cervicalgia: Secondary | ICD-10-CM | POA: Diagnosis not present

## 2023-07-25 DIAGNOSIS — M546 Pain in thoracic spine: Secondary | ICD-10-CM | POA: Diagnosis not present

## 2023-07-25 DIAGNOSIS — M545 Low back pain, unspecified: Secondary | ICD-10-CM | POA: Diagnosis not present

## 2023-08-08 DIAGNOSIS — M25511 Pain in right shoulder: Secondary | ICD-10-CM | POA: Diagnosis not present
